# Patient Record
Sex: Male | Born: 1935 | Race: White | Hispanic: No | Marital: Married | Smoking: Never smoker
Health system: Southern US, Community
[De-identification: ages and names within clinical notes are randomized; demographics above are authoritative.]

## PROBLEM LIST (undated history)

## (undated) DIAGNOSIS — C4490 Unspecified malignant neoplasm of skin, unspecified: Secondary | ICD-10-CM

## (undated) DIAGNOSIS — F0280 Dementia in other diseases classified elsewhere without behavioral disturbance: Secondary | ICD-10-CM

## (undated) DIAGNOSIS — G309 Alzheimer's disease, unspecified: Secondary | ICD-10-CM

## (undated) DIAGNOSIS — E785 Hyperlipidemia, unspecified: Secondary | ICD-10-CM

## (undated) DIAGNOSIS — Z8546 Personal history of malignant neoplasm of prostate: Secondary | ICD-10-CM

## (undated) DIAGNOSIS — H269 Unspecified cataract: Secondary | ICD-10-CM

## (undated) HISTORY — DX: Unspecified cataract: H26.9

## (undated) HISTORY — DX: Hyperlipidemia, unspecified: E78.5

## (undated) HISTORY — DX: Personal history of malignant neoplasm of prostate: Z85.46

## (undated) HISTORY — PX: PROSTATE SURGERY: SHX751

## (undated) HISTORY — PX: HIGH DOSE RADIATION IMPLANT INSERTION: SHX6258

---

## 1999-07-20 ENCOUNTER — Other Ambulatory Visit: Admission: RE | Admit: 1999-07-20 | Discharge: 1999-07-20 | Payer: Self-pay | Admitting: Urology

## 1999-08-03 ENCOUNTER — Encounter: Admission: RE | Admit: 1999-08-03 | Discharge: 1999-11-01 | Payer: Self-pay | Admitting: Radiation Oncology

## 1999-10-17 ENCOUNTER — Encounter: Payer: Self-pay | Admitting: Urology

## 1999-10-17 ENCOUNTER — Ambulatory Visit (HOSPITAL_BASED_OUTPATIENT_CLINIC_OR_DEPARTMENT_OTHER): Admission: RE | Admit: 1999-10-17 | Discharge: 1999-10-17 | Payer: Self-pay | Admitting: Urology

## 1999-11-07 ENCOUNTER — Encounter: Payer: Self-pay | Admitting: Radiation Oncology

## 1999-11-07 ENCOUNTER — Encounter: Admission: RE | Admit: 1999-11-07 | Discharge: 2000-02-05 | Payer: Self-pay | Admitting: Radiation Oncology

## 2001-08-06 ENCOUNTER — Ambulatory Visit (HOSPITAL_COMMUNITY): Admission: RE | Admit: 2001-08-06 | Discharge: 2001-08-06 | Payer: Self-pay | Admitting: Gastroenterology

## 2004-11-01 ENCOUNTER — Ambulatory Visit: Payer: Self-pay | Admitting: Internal Medicine

## 2004-11-07 ENCOUNTER — Ambulatory Visit: Payer: Self-pay | Admitting: Gastroenterology

## 2004-11-15 ENCOUNTER — Ambulatory Visit: Payer: Self-pay | Admitting: Gastroenterology

## 2005-07-12 ENCOUNTER — Ambulatory Visit: Payer: Self-pay | Admitting: Internal Medicine

## 2005-07-19 ENCOUNTER — Ambulatory Visit: Payer: Self-pay | Admitting: Internal Medicine

## 2007-10-13 ENCOUNTER — Ambulatory Visit: Payer: Self-pay | Admitting: Internal Medicine

## 2007-10-13 DIAGNOSIS — G459 Transient cerebral ischemic attack, unspecified: Secondary | ICD-10-CM | POA: Insufficient documentation

## 2007-10-13 DIAGNOSIS — J189 Pneumonia, unspecified organism: Secondary | ICD-10-CM

## 2007-10-13 DIAGNOSIS — D696 Thrombocytopenia, unspecified: Secondary | ICD-10-CM

## 2007-10-13 DIAGNOSIS — E782 Mixed hyperlipidemia: Secondary | ICD-10-CM | POA: Insufficient documentation

## 2007-10-13 DIAGNOSIS — E785 Hyperlipidemia, unspecified: Secondary | ICD-10-CM

## 2007-10-13 DIAGNOSIS — F0390 Unspecified dementia without behavioral disturbance: Secondary | ICD-10-CM | POA: Insufficient documentation

## 2007-10-21 ENCOUNTER — Ambulatory Visit: Payer: Self-pay | Admitting: Internal Medicine

## 2007-10-27 ENCOUNTER — Encounter (INDEPENDENT_AMBULATORY_CARE_PROVIDER_SITE_OTHER): Payer: Self-pay | Admitting: *Deleted

## 2007-11-03 ENCOUNTER — Encounter (INDEPENDENT_AMBULATORY_CARE_PROVIDER_SITE_OTHER): Payer: Self-pay | Admitting: *Deleted

## 2007-11-04 ENCOUNTER — Ambulatory Visit: Payer: Self-pay | Admitting: Internal Medicine

## 2007-11-04 DIAGNOSIS — Z8546 Personal history of malignant neoplasm of prostate: Secondary | ICD-10-CM

## 2007-11-04 LAB — CONVERTED CEMR LAB
Cholesterol, target level: 200 mg/dL
HDL goal, serum: 40 mg/dL
LDL Goal: 100 mg/dL

## 2008-01-26 ENCOUNTER — Ambulatory Visit: Payer: Self-pay | Admitting: Internal Medicine

## 2008-01-26 DIAGNOSIS — K625 Hemorrhage of anus and rectum: Secondary | ICD-10-CM

## 2008-01-27 ENCOUNTER — Ambulatory Visit: Payer: Self-pay | Admitting: Internal Medicine

## 2008-02-07 LAB — CONVERTED CEMR LAB
HCT: 38.3 % — ABNORMAL LOW (ref 39.0–52.0)
MCHC: 33.9 g/dL (ref 30.0–36.0)
MCV: 92.8 fL (ref 78.0–100.0)
Platelets: 106 10*3/uL — ABNORMAL LOW (ref 150–400)
Potassium: 3.5 meq/L (ref 3.5–5.1)

## 2008-02-09 ENCOUNTER — Encounter (INDEPENDENT_AMBULATORY_CARE_PROVIDER_SITE_OTHER): Payer: Self-pay | Admitting: *Deleted

## 2008-09-30 ENCOUNTER — Ambulatory Visit: Payer: Self-pay | Admitting: Internal Medicine

## 2008-09-30 DIAGNOSIS — D649 Anemia, unspecified: Secondary | ICD-10-CM | POA: Insufficient documentation

## 2008-09-30 DIAGNOSIS — R0989 Other specified symptoms and signs involving the circulatory and respiratory systems: Secondary | ICD-10-CM

## 2008-10-03 LAB — CONVERTED CEMR LAB
TSH: 0.58 microintl units/mL (ref 0.35–5.50)
Vitamin B-12: 1092 pg/mL — ABNORMAL HIGH (ref 211–911)

## 2008-10-04 ENCOUNTER — Encounter (INDEPENDENT_AMBULATORY_CARE_PROVIDER_SITE_OTHER): Payer: Self-pay | Admitting: *Deleted

## 2008-10-05 ENCOUNTER — Encounter: Payer: Self-pay | Admitting: Internal Medicine

## 2008-10-05 ENCOUNTER — Ambulatory Visit: Payer: Self-pay | Admitting: Cardiovascular Disease

## 2008-10-05 ENCOUNTER — Ambulatory Visit: Payer: Self-pay

## 2008-10-06 ENCOUNTER — Telehealth (INDEPENDENT_AMBULATORY_CARE_PROVIDER_SITE_OTHER): Payer: Self-pay | Admitting: *Deleted

## 2008-10-06 ENCOUNTER — Encounter (INDEPENDENT_AMBULATORY_CARE_PROVIDER_SITE_OTHER): Payer: Self-pay | Admitting: *Deleted

## 2008-10-07 ENCOUNTER — Ambulatory Visit: Payer: Self-pay | Admitting: Internal Medicine

## 2008-10-07 ENCOUNTER — Encounter (INDEPENDENT_AMBULATORY_CARE_PROVIDER_SITE_OTHER): Payer: Self-pay | Admitting: *Deleted

## 2008-10-07 LAB — CONVERTED CEMR LAB
OCCULT 1: NEGATIVE
OCCULT 2: NEGATIVE
OCCULT 3: NEGATIVE

## 2008-10-13 ENCOUNTER — Telehealth (INDEPENDENT_AMBULATORY_CARE_PROVIDER_SITE_OTHER): Payer: Self-pay | Admitting: *Deleted

## 2008-10-19 ENCOUNTER — Encounter: Payer: Self-pay | Admitting: Internal Medicine

## 2009-08-03 ENCOUNTER — Encounter: Payer: Self-pay | Admitting: Internal Medicine

## 2009-10-11 ENCOUNTER — Encounter: Payer: Self-pay | Admitting: Cardiovascular Disease

## 2009-10-12 ENCOUNTER — Ambulatory Visit: Payer: Self-pay

## 2009-10-12 ENCOUNTER — Encounter: Payer: Self-pay | Admitting: Cardiovascular Disease

## 2009-12-20 ENCOUNTER — Telehealth (INDEPENDENT_AMBULATORY_CARE_PROVIDER_SITE_OTHER): Payer: Self-pay | Admitting: *Deleted

## 2010-06-03 ENCOUNTER — Encounter: Payer: Self-pay | Admitting: Internal Medicine

## 2010-07-03 ENCOUNTER — Encounter: Payer: Self-pay | Admitting: Internal Medicine

## 2010-10-22 LAB — CONVERTED CEMR LAB: Glucose, Bld: 92 mg/dL (ref 70–99)

## 2010-10-26 NOTE — Miscellaneous (Signed)
Summary: [facility]  [facility]   Imported By: Lanelle Bal 06/14/2010 09:31:17  _____________________________________________________________________  External Attachment:    Type:   Image     Comment:   External Document

## 2010-10-26 NOTE — Progress Notes (Signed)
Summary: Referral Request  Phone Note Call from Patient Call back at Home Phone [phone]   Caller: Spouse: Alvino Chapel Summary of Call: Message left on VM: Patient would like a referral faxed to [phone] for a hearing test. Patient already called to set up appointment and was instructed to contact his primary.    Chrae Malloy  December 20, 2009 1:38 PM

## 2010-10-26 NOTE — Letter (Signed)
Summary: [facility]  [facility]   Imported By: Lanelle Bal 07/15/2010 10:44:55  _____________________________________________________________________  External Attachment:    Type:   Image     Comment:   External Document

## 2010-10-26 NOTE — Miscellaneous (Signed)
Summary: Orders Update  Clinical Lists Changes  Orders: Added new Test order of Carotid Duplex (Carotid Duplex) - Signed 

## 2011-02-09 NOTE — Op Note (Signed)
[place]  Patient:    KHAMARI SHEEHAN                   MRN: [id number] Proc. Date: 10/17/99 Adm. Date:  [id number] Disc. Date: [id number] Attending:  Lauree Chandler CC:         Titus Dubin. Alwyn Ren, M.D. [facility]             Wynn Banker, M.D.                           Operative Report  PREOPERATIVE DIAGNOSIS:  Prostatic carcinoma.  POSTOPERATIVE DIAGNOSIS:  Prostatic carcinoma.  OPERATION PERFORMED:  Radioactive seed implantation of the prostate and cystoscopy.  SURGEON:  Maretta Bees. Vonita Moss, M.D.  ASSISTANT:  Wynn Banker, M.D.  ANESTHESIA:  INDICATIONS FOR PROCEDURE:  This 77 year old white male who was in general good  health was found to have a PSA of 7.43 and a prostatic biopsy revealed a Gleason grade 5 carcinoma of the prostate.  He opted for radioactive seed implantation n view of his expected good life survival curve.  He was counseled about the procedure.  DESCRIPTION OF PROCEDURE:  The patient was brought to the operating room and placed in the lithotomy position.  The external genitalia were prepped and draped in the usual fashion.  A Foley catheter was inserted as was the rectal tube.  The transrectal ultrasound probe was inserted and correlated with the simulation images and there was noted that the urethra was off center so the seed placement was adjusted to avoid the urethra.  Then under fluoroscopic and ultrasound control, a total of 145 seeds were placed by means of 29 needles with 0.430 mCi per seed.  These seeds were well positioned and nicely distributed.  The patient was then cystoscoped and there were no intraurethral or intravesical seeds noted.  The Foley catheter was reinserted and he was taken to the recovery room in good condition  having tolerated the procedure well. DD:  10/17/99 TD:  10/17/99 Job: [id number]

## 2011-11-06 ENCOUNTER — Other Ambulatory Visit: Payer: Self-pay | Admitting: *Deleted

## 2011-11-06 ENCOUNTER — Encounter (INDEPENDENT_AMBULATORY_CARE_PROVIDER_SITE_OTHER): Payer: Medicare Other | Admitting: *Deleted

## 2011-11-06 DIAGNOSIS — I6529 Occlusion and stenosis of unspecified carotid artery: Secondary | ICD-10-CM

## 2012-09-20 ENCOUNTER — Emergency Department (HOSPITAL_BASED_OUTPATIENT_CLINIC_OR_DEPARTMENT_OTHER)
Admission: EM | Admit: 2012-09-20 | Discharge: 2012-09-21 | Disposition: A | Discharge status: Home / Self Care | Payer: Medicare Other | Attending: Emergency Medicine | Admitting: Emergency Medicine

## 2012-09-20 ENCOUNTER — Encounter (HOSPITAL_BASED_OUTPATIENT_CLINIC_OR_DEPARTMENT_OTHER): Payer: Self-pay

## 2012-09-20 ENCOUNTER — Emergency Department (HOSPITAL_BASED_OUTPATIENT_CLINIC_OR_DEPARTMENT_OTHER): Payer: Medicare Other

## 2012-09-20 DIAGNOSIS — R05 Cough: Secondary | ICD-10-CM | POA: Insufficient documentation

## 2012-09-20 DIAGNOSIS — R059 Cough, unspecified: Secondary | ICD-10-CM | POA: Insufficient documentation

## 2012-09-20 DIAGNOSIS — G309 Alzheimer's disease, unspecified: Secondary | ICD-10-CM | POA: Insufficient documentation

## 2012-09-20 DIAGNOSIS — F0280 Dementia in other diseases classified elsewhere without behavioral disturbance: Secondary | ICD-10-CM | POA: Insufficient documentation

## 2012-09-20 DIAGNOSIS — R269 Unspecified abnormalities of gait and mobility: Secondary | ICD-10-CM | POA: Insufficient documentation

## 2012-09-20 DIAGNOSIS — J189 Pneumonia, unspecified organism: Secondary | ICD-10-CM

## 2012-09-20 HISTORY — DX: Alzheimer's disease, unspecified: G30.9

## 2012-09-20 HISTORY — DX: Dementia in other diseases classified elsewhere, unspecified severity, without behavioral disturbance, psychotic disturbance, mood disturbance, and anxiety: F02.80

## 2012-09-20 LAB — CBC
Hemoglobin: 12.9 g/dL — ABNORMAL LOW (ref 13.0–17.0)
RBC: 4.19 MIL/uL — ABNORMAL LOW (ref 4.22–5.81)

## 2012-09-20 LAB — BASIC METABOLIC PANEL
CO2: 22 mEq/L (ref 19–32)
Glucose, Bld: 127 mg/dL — ABNORMAL HIGH (ref 70–99)
Potassium: 4 mEq/L (ref 3.5–5.1)
Sodium: 139 mEq/L (ref 135–145)

## 2012-09-20 MED ORDER — LEVOFLOXACIN 500 MG PO TABS: 500.0000 mg | ORAL_TABLET | Freq: Every day | ORAL | Status: DC

## 2012-09-20 MED ORDER — AZITHROMYCIN 250 MG PO TABS
500.0000 mg | ORAL_TABLET | Freq: Once | ORAL | Status: AC
  Administered 2012-09-20: 500 mg via ORAL
  Filled 2012-09-20: qty 2

## 2012-09-20 MED ORDER — DEXTROSE 5 % IV SOLN
1.0000 g | Freq: Once | INTRAVENOUS | Status: AC
  Administered 2012-09-20: 1 g via INTRAVENOUS
  Filled 2012-09-20: qty 10

## 2012-09-20 MED ORDER — MOXIFLOXACIN HCL 400 MG PO TABS: 400.0000 mg | ORAL_TABLET | Freq: Every day | ORAL | Status: DC

## 2012-09-20 MED ORDER — ACETAMINOPHEN 325 MG PO TABS: 650.0000 mg | ORAL_TABLET | Freq: Once | ORAL | Status: DC

## 2012-09-20 MED ORDER — SODIUM CHLORIDE 0.9 % IV BOLUS (SEPSIS)
500.0000 mL | Freq: Once | INTRAVENOUS | Status: AC
  Administered 2012-09-20: 500 mL via INTRAVENOUS

## 2012-09-20 NOTE — ED Provider Notes (Signed)
History   This chart was scribed for Ethelda Chick, MD scribed by Magnus Sinning. The patient was seen in room MH02/MH02 at 22:02   CSN: [id number]  Arrival date & time 09/20/12  2057  Chief Complaint  Patient presents with  . Fever    (Consider location/radiation/quality/duration/timing/severity/associated sxs/prior treatment) HPI Comments: Vincent Lee is a 78 y.o. male BIB EMS who presents to the Emergency Department complaining of a constant fever that began approximately 3 hours ago.   Pt has hx of alzheimer's. Wife states she noticed the pt was unsteady standing and very warm after dinner this evening. She notes pt has had a mild productive cough, which she notes appeared better today. The patient has hx of PNA 6 times, but notes this is not current. She says he has been eating and drinking well.   Patient is a 78 y.o. male presenting with fever. The history is provided by the spouse. No language interpreter was used.  Fever Primary symptoms of the febrile illness include fever and cough. Primary symptoms do not include abdominal pain. The current episode started today. This is a new problem. The problem has not changed since onset. The fever began today. The fever has been unchanged since its onset. The maximum temperature recorded prior to his arrival was 100 to 100.9 F.  The cough began 3 to 5 days ago. The cough is new. The cough is hacking and productive. There is nondescript sputum produced.    Past Medical History  Diagnosis Date  . Alzheimer disease     History reviewed. No pertinent past surgical history.  No family history on file.  History  Substance Use Topics  . Smoking status: Never Smoker   . Smokeless tobacco: Not on file  . Alcohol Use: No      Review of Systems  Constitutional: Positive for fever.  Respiratory: Positive for cough.   Gastrointestinal: Negative for abdominal pain.  Musculoskeletal: Positive for gait problem.  All other  systems reviewed and are negative.    Allergies  Review of patient's allergies indicates no known allergies.  Home Medications   Current Outpatient Rx  Name  Route  Sig  Dispense  Refill  . DONEPEZIL HCL 10 MG PO TABS   Oral   Take 10 mg by mouth at bedtime as needed.         Marland Kitchen MEMANTINE HCL 10 MG PO TABS   Oral   Take 10 mg by mouth 2 (two) times daily.         Marland Kitchen LEVOFLOXACIN 500 MG PO TABS   Oral   Take 1 tablet (500 mg total) by mouth daily.   10 tablet   0     Pulse 88  Temp 100.8 F (38.2 C) (Oral)  Resp 23  SpO2 96%  Physical Exam  Nursing note and vitals reviewed. Constitutional: He is oriented to person, place, and time. He appears well-developed and well-nourished. No distress.  HENT:  Head: Normocephalic and atraumatic.  Mouth/Throat: Oropharynx is clear and moist.  Eyes: Conjunctivae normal and EOM are normal.  Neck: Neck supple. No tracheal deviation present.  Cardiovascular: Normal rate, regular rhythm and normal heart sounds.   Pulmonary/Chest: Effort normal and breath sounds normal. No respiratory distress. He has no wheezes. He has no rales.  Abdominal: Soft. Bowel sounds are normal. He exhibits no distension. There is no tenderness.  Musculoskeletal: Normal range of motion. He exhibits no edema and no tenderness.  Neurological: He is  alert and oriented to person, place, and time. No sensory deficit.  Skin: Skin is warm and dry.  Psychiatric: He has a normal mood and affect. His behavior is normal.    ED Course  Procedures (including critical care time) DIAGNOSTIC STUDIES: Oxygen Saturation is 96% on room air, normal by my interpretation.    COORDINATION OF CARE: 22:04: Physical exam performed.  Labs Reviewed  CBC - Abnormal; Notable for the following:    RBC 4.19 (*)     Hemoglobin 12.9 (*)     HCT 37.7 (*)     Platelets 53 (*)     All other components within normal limits  BASIC METABOLIC PANEL - Abnormal; Notable for the  following:    Glucose, Bld 127 (*)     GFR calc non Af Amer 77 (*)     GFR calc Af Amer 89 (*)     All other components within normal limits  URINALYSIS, ROUTINE W REFLEX MICROSCOPIC - Abnormal; Notable for the following:    Color, Urine RED (*)  BIOCHEMICALS MAY BE AFFECTED BY COLOR   APPearance CLOUDY (*)     Hgb urine dipstick LARGE (*)     Bilirubin Urine SMALL (*)     Ketones, ur 15 (*)     Protein, ur 30 (*)     Leukocytes, UA SMALL (*)     All other components within normal limits  URINE MICROSCOPIC-ADD ON - Abnormal; Notable for the following:    Squamous Epithelial / LPF FEW (*)     All other components within normal limits  URINE CULTURE   Dg Chest 2 View  09/20/2012  *RADIOLOGY REPORT*  Clinical Data: Fever.  CHEST - 2 VIEW  Comparison: None.  Findings: Underlying COPD present.  Subtle areas of probable pneumonia are suspected in both lower lung zones and also potentially the lingula.  No pulmonary edema or pleural fluid identified.  Heart size and mediastinal contours are within normal limits.  Visualized bony structures are unremarkable.  IMPRESSION: Probable developing bilateral pneumonia superimposed on underlying COPD.   Original Report Authenticated By: Irish Lack, M.D.      1. Community acquired pneumonia       MDM  Pt presenting with c/o fever. Fever began shortly after eating dinner.  Has had mild cough for past few days.  No other symptoms.  Wife states he is currently at his baseline.  CXR shows probable developoing pneumonia (xray images reviewed by me as well) pt has normal vital signs, no oxygen requirement.  No recent abx or hospitalizations.  Pt started on rocephin and zithromax for CAP.  UA shows RBCs- had unsuccessful attempt at urinary cath.  Urine sent for culture, but I think the mild pneumonia is more likely to be source of low grade temp.  Pt discharged on levaquin.  Discharged with strict return precautions.  Pt and family agreeable with  plan.   I personally performed the services described in this documentation, which was scribed in my presence. The recorded information has been reviewed and is accurate.         Ethelda Chick, MD 09/21/12 [phone]

## 2012-09-20 NOTE — ED Notes (Signed)
Patient arrived by EMS from home after spouse noticed that patient developed fever at 6pm this evening. Spouse concerned with the sudden onset, no other complaints. Has remote history of pneumonia. Patient received 1000mg  of tylenol pta. Patient with alzheimer's.

## 2012-09-20 NOTE — ED Notes (Signed)
I assisted patient with urinal, got sample, took to lab.

## 2012-09-20 NOTE — ED Notes (Signed)
I took vitals and placed patient in gown, wife at bedside stated they did not get his hearing aids before arrival. EMS stated patient is alzheimer patient.

## 2012-09-21 LAB — URINALYSIS, ROUTINE W REFLEX MICROSCOPIC
Glucose, UA: NEGATIVE mg/dL
Protein, ur: 30 mg/dL — AB
Specific Gravity, Urine: 1.028 (ref 1.005–1.030)

## 2012-09-21 LAB — URINE MICROSCOPIC-ADD ON

## 2012-09-22 LAB — URINE CULTURE
Colony Count: NO GROWTH
Culture: NO GROWTH

## 2012-09-23 ENCOUNTER — Ambulatory Visit (INDEPENDENT_AMBULATORY_CARE_PROVIDER_SITE_OTHER): Payer: Medicare Other | Admitting: Internal Medicine

## 2012-09-23 ENCOUNTER — Encounter: Payer: Self-pay | Admitting: Internal Medicine

## 2012-09-23 VITALS — BP 126/60 | HR 67 | Temp 97.9°F | Wt 123.2 lb

## 2012-09-23 DIAGNOSIS — J189 Pneumonia, unspecified organism: Secondary | ICD-10-CM

## 2012-09-23 NOTE — Patient Instructions (Addendum)
Order for x-rays entered into  the computer; these will be performed at [facility] 09/29/2012. No appointment is necessary.  Please  blowup at least 5-10  balloons a day to enhance inflation of the lungs and prevent atelectasis & delay resolution of pneumonia as we discussed.

## 2012-09-23 NOTE — Progress Notes (Signed)
  Subjective:    Patient ID: Vincent Lee, male    DOB: 09/12/1921, 78 y.o.   MRN: [id number]  HPI The respiratory tract symptoms began  09/20/12 as weakness with "wobbly " gait, hot & confusion.  No chills and sweats initially present ,but sweating after IV antibiotics in ER  Cough was not associated with shortness of breath or wheezing  Sputum was not visualized    Fever resolved by 12/29 w/o  NSAIDS or Tylenol  There is no history of asthma. The patient had never smoked                  Review of Systems Symptoms not present included frontal headache, facial pain, dental pain, sore throat, nasal purulence, earache , and otic discharge  Itchy , watery eyes & sneezing were not noted.      Objective:   Physical Exam  General appearance:thin but good health ;well nourished; no acute distress or increased work of breathing is present.  No  lymphadenopathy about the head, neck, or axilla noted.  Eyes: No conjunctival inflammation or lid edema is present.  Ears:  Hearing aids bilaterally Nose:  External nasal examination shows no deformity or inflammation. Nasal mucosa are pink and moist without lesions or exudates. No septal dislocation or deviation.No obstruction to airflow.  Oral exam: Dental hygiene is good; lips and gums are healthy appearing.There is no oropharyngeal erythema or exudate noted.  Neck:  No deformities,  masses, or tenderness noted.    Heart:  Normal rate ; some respiratory variation to  rhythm. S1 and S2 normal without gallop, murmur, click, rub or other extra sounds.  Lungs:Chest clear to auscultation; no wheezes, rhonchi,rales ,or rubs present.No increased work of breathing.  BS slightly decreased @ bases Extremities:  No cyanosis, edema, or clubbing  noted  Skin: Warm & dry        Assessment & Plan:  #1 PNA; clinically stable

## 2012-09-29 ENCOUNTER — Ambulatory Visit (HOSPITAL_BASED_OUTPATIENT_CLINIC_OR_DEPARTMENT_OTHER)
Admission: RE | Admit: 2012-09-29 | Discharge: 2012-09-29 | Disposition: A | Discharge status: Home / Self Care | Payer: Medicare Other | Source: Ambulatory Visit | Attending: Internal Medicine | Admitting: Internal Medicine

## 2012-09-29 DIAGNOSIS — J189 Pneumonia, unspecified organism: Secondary | ICD-10-CM

## 2012-09-29 DIAGNOSIS — R05 Cough: Secondary | ICD-10-CM | POA: Insufficient documentation

## 2012-09-29 DIAGNOSIS — R059 Cough, unspecified: Secondary | ICD-10-CM | POA: Insufficient documentation

## 2012-12-19 ENCOUNTER — Encounter: Payer: Self-pay | Admitting: Internal Medicine

## 2012-12-19 ENCOUNTER — Ambulatory Visit (INDEPENDENT_AMBULATORY_CARE_PROVIDER_SITE_OTHER): Payer: Medicare Other | Admitting: Internal Medicine

## 2012-12-19 VITALS — BP 120/72 | HR 60 | Temp 97.8°F | Resp 12 | Ht 68.08 in | Wt 119.0 lb

## 2012-12-19 DIAGNOSIS — Z8546 Personal history of malignant neoplasm of prostate: Secondary | ICD-10-CM

## 2012-12-19 DIAGNOSIS — Z111 Encounter for screening for respiratory tuberculosis: Secondary | ICD-10-CM

## 2012-12-19 DIAGNOSIS — R413 Other amnesia: Secondary | ICD-10-CM

## 2012-12-19 DIAGNOSIS — Z23 Encounter for immunization: Secondary | ICD-10-CM

## 2012-12-19 NOTE — Progress Notes (Signed)
  Subjective:    Patient ID: Vincent Lee, male    DOB: 11-Mar-1921, 79 y.o.   MRN: [id number]  HPI  He is wife are relocating to retirement facility,[facility].Medical assessment in short update required.   He has no active or acute health issues.  Past medical history was reviewed in detail and updated    Review of Systems  He is followed by Dr. Terrace Arabia, neurologist and is on generic Aricept and Namenda     Objective:   Physical Exam Gen.: Thin but healthy and adequately nourished in appearance. Alert  and cooperative throughout exam. Appears younger than stated age  Head: Normocephalic without obvious abnormalities; pattern alopecia  Eyes: No corneal or conjunctival inflammation noted.  Ears: External  ear exam reveals no significant lesions or deformities.  Hearing aids  bilaterally. Nose: External nasal exam reveals no deformity or inflammation. Nasal mucosa are pink and moist. No lesions or exudates noted.  Mouth: Oral mucosa and oropharynx reveal no lesions or exudates. Teeth in good repair. Lower partial Neck: No deformities, masses, or tenderness noted.  Thyroid normal. Lungs: Normal respiratory effort; chest expands symmetrically. Lungs are clear to auscultation without rales, wheezes, or increased work of breathing. Heart: Normal rate and rhythm. Normal S1 and S2. No gallop, click, or rub. Grade 1/6 systolic murmur. Abdomen: Bowel sounds normal; abdomen soft and nontender. No masses, organomegaly or hernias noted. Genitalia: deferred                                Musculoskeletal/extremities: Accentuated curvature of mid thoracic spine. No clubbing, cyanosis, edema, or significant extremity  deformity noted. Tone & strength  Normal. Joints normal . Nail health good. Able to lie down & sit up w/o help. Negative SLR bilaterally Vascular: Carotid, radial artery, dorsalis pedis and  posterior tibial pulses are full and equal. R > L carotid bruits present. Neurologic:  Alert but not oriented to date or President. Deep tendon reflexes symmetrical and normal.  Gait normal  .        Skin: Intact without suspicious lesions or rashes. Lymph: No cervical, axillary lymphadenopathy present. Psych:Reserved demeanor. Normally interactive                                                                                        Assessment & Plan:  #1 comprehensive exam; no acute findings  Plan: form completed

## 2012-12-19 NOTE — Patient Instructions (Addendum)
Review and correct the record as indicated. Please share record with [facility]  medical staff .  If you activate the  My Chart system; lab & Xray results will be released directly  to you as soon as I review & address these through the computer. If you choose not to sign up for My Chart within 36 hours of labs being drawn; results will be reviewed & interpretation added before being copied & mailed, causing a delay in getting the results to you.If you do not receive that report within 7-10 days ,please call. Additionally you can use this system to gain direct  access to your records  if  out of town or @ an office of a  physician who is not in  the My Chart network.  This improves continuity of care & places you in control of your medical record.

## 2012-12-22 ENCOUNTER — Telehealth: Payer: Self-pay | Admitting: *Deleted

## 2012-12-22 ENCOUNTER — Ambulatory Visit: Payer: Medicare Other | Admitting: *Deleted

## 2012-12-22 ENCOUNTER — Encounter: Payer: Self-pay | Admitting: *Deleted

## 2012-12-22 DIAGNOSIS — Z0000 Encounter for general adult medical examination without abnormal findings: Secondary | ICD-10-CM

## 2012-12-22 LAB — TB SKIN TEST
Induration: 0 mm
TB Skin Test: NEGATIVE

## 2013-02-15 ENCOUNTER — Emergency Department (HOSPITAL_BASED_OUTPATIENT_CLINIC_OR_DEPARTMENT_OTHER): Payer: Medicare Other

## 2013-02-15 ENCOUNTER — Encounter (HOSPITAL_BASED_OUTPATIENT_CLINIC_OR_DEPARTMENT_OTHER): Payer: Self-pay

## 2013-02-15 ENCOUNTER — Emergency Department (HOSPITAL_BASED_OUTPATIENT_CLINIC_OR_DEPARTMENT_OTHER)
Admission: EM | Admit: 2013-02-15 | Discharge: 2013-02-15 | Disposition: A | Discharge status: Home / Self Care | Payer: Medicare Other | Attending: Emergency Medicine | Admitting: Emergency Medicine

## 2013-02-15 DIAGNOSIS — Z862 Personal history of diseases of the blood and blood-forming organs and certain disorders involving the immune mechanism: Secondary | ICD-10-CM | POA: Insufficient documentation

## 2013-02-15 DIAGNOSIS — R197 Diarrhea, unspecified: Secondary | ICD-10-CM

## 2013-02-15 DIAGNOSIS — G309 Alzheimer's disease, unspecified: Secondary | ICD-10-CM | POA: Insufficient documentation

## 2013-02-15 DIAGNOSIS — D696 Thrombocytopenia, unspecified: Secondary | ICD-10-CM | POA: Insufficient documentation

## 2013-02-15 DIAGNOSIS — R112 Nausea with vomiting, unspecified: Secondary | ICD-10-CM | POA: Insufficient documentation

## 2013-02-15 DIAGNOSIS — Z79899 Other long term (current) drug therapy: Secondary | ICD-10-CM | POA: Insufficient documentation

## 2013-02-15 DIAGNOSIS — Z8639 Personal history of other endocrine, nutritional and metabolic disease: Secondary | ICD-10-CM | POA: Insufficient documentation

## 2013-02-15 DIAGNOSIS — F0280 Dementia in other diseases classified elsewhere without behavioral disturbance: Secondary | ICD-10-CM | POA: Insufficient documentation

## 2013-02-15 DIAGNOSIS — Z8546 Personal history of malignant neoplasm of prostate: Secondary | ICD-10-CM | POA: Insufficient documentation

## 2013-02-15 LAB — CBC WITH DIFFERENTIAL/PLATELET
Lymphocytes Relative: 12 % (ref 12–46)
Lymphs Abs: 0.7 10*3/uL (ref 0.7–4.0)
MCH: 31.5 pg (ref 26.0–34.0)
MCHC: 34.4 g/dL (ref 30.0–36.0)
Myelocytes: 0 %
Neutro Abs: 3.6 10*3/uL (ref 1.7–7.7)
Neutrophils Relative %: 62 % (ref 43–77)
Platelets: 35 10*3/uL — ABNORMAL LOW (ref 150–400)
Promyelocytes Absolute: 0 %
RBC: 4.32 MIL/uL (ref 4.22–5.81)
nRBC: 0 /100 WBC

## 2013-02-15 LAB — COMPREHENSIVE METABOLIC PANEL
ALT: 11 U/L (ref 0–53)
AST: 25 U/L (ref 0–37)
Alkaline Phosphatase: 60 U/L (ref 39–117)
CO2: 24 mEq/L (ref 19–32)
Calcium: 9.1 mg/dL (ref 8.4–10.5)
Chloride: 106 mEq/L (ref 96–112)
GFR calc non Af Amer: 73 mL/min — ABNORMAL LOW (ref >90)
Potassium: 3.4 mEq/L — ABNORMAL LOW (ref 3.5–5.1)
Sodium: 141 mEq/L (ref 135–145)
Total Bilirubin: 0.7 mg/dL (ref 0.3–1.2)

## 2013-02-15 LAB — URINALYSIS, ROUTINE W REFLEX MICROSCOPIC
Bilirubin Urine: NEGATIVE
Glucose, UA: NEGATIVE mg/dL
Protein, ur: NEGATIVE mg/dL

## 2013-02-15 LAB — URINE MICROSCOPIC-ADD ON

## 2013-02-15 MED ORDER — IOHEXOL 300 MG/ML  SOLN
25.0000 mL | Freq: Once | INTRAMUSCULAR | Status: AC | PRN
  Administered 2013-02-15: 25 mL via ORAL

## 2013-02-15 MED ORDER — DIPHENOXYLATE-ATROPINE 2.5-0.025 MG PO TABS
1.0000 | ORAL_TABLET | Freq: Once | ORAL | Status: AC
  Administered 2013-02-15: 1 via ORAL
  Filled 2013-02-15: qty 1

## 2013-02-15 MED ORDER — SODIUM CHLORIDE 0.9 % IV BOLUS (SEPSIS)
1000.0000 mL | Freq: Once | INTRAVENOUS | Status: AC
  Administered 2013-02-15: 1000 mL via INTRAVENOUS

## 2013-02-15 MED ORDER — IOHEXOL 300 MG/ML  SOLN
100.0000 mL | Freq: Once | INTRAMUSCULAR | Status: AC | PRN
  Administered 2013-02-15: 100 mL via INTRAVENOUS

## 2013-02-15 MED ORDER — ONDANSETRON HCL 4 MG/2ML IJ SOLN
4.0000 mg | Freq: Once | INTRAMUSCULAR | Status: AC
  Administered 2013-02-15: 4 mg via INTRAVENOUS
  Filled 2013-02-15: qty 2

## 2013-02-15 MED ORDER — LOPERAMIDE HCL 2 MG PO CAPS: 2.0000 mg | ORAL_CAPSULE | Freq: Four times a day (QID) | ORAL | Status: DC | PRN

## 2013-02-15 NOTE — ED Notes (Signed)
Pt unable to void at this time. Pt given urinal and is aware we need urine sample.

## 2013-02-15 NOTE — ED Notes (Signed)
Wife states that the patient has had multiple episodes of diarrhea and intermittently vomiting since Friday.  Symptoms resolved Saturday, but this morning came back, no vomiting today, but many episodes of diarrhea.

## 2013-02-15 NOTE — ED Provider Notes (Addendum)
History     CSN: [id number]  Arrival date & time 02/15/13  0726   First MD Initiated Contact with Patient 02/15/13 [phone]      Chief Complaint  Patient presents with  . Diarrhea    (Consider location/radiation/quality/duration/timing/severity/associated sxs/prior treatment) HPI Comments: Patient complains of diarrhea. Most of the history is obtained from the wife as the patient has some mild dementia and has trouble remembering details. The wife states that today's he oh the patient had some vomiting and diarrhea. Yesterday he felt better with no diarrhea no vomiting. He ate some cereal, banana, and RBCs chicken sandwich. Last night at about 7 PM he started having diarrhea again. He's had about 12 episodes of watery nonbloody diarrhea. He's had no further episodes of vomiting. He denies any abdominal pain. He's had no fevers or chills. He had no cough or chest congestion. He's had no other recent illnesses. He's had no recent antibiotic use or recent hospitalizations.  Patient is a 79 y.o. male presenting with diarrhea.  Diarrhea Associated symptoms: vomiting   Associated symptoms: no abdominal pain, no arthralgias, no chills, no diaphoresis, no fever and no headaches     Past Medical History  Diagnosis Date  . Alzheimer disease   . Hyperlipidemia   . H/O prostate cancer     Past Surgical History  Procedure Laterality Date  . High dose radiation implant insertion      for prostate ca    Family History  Problem Relation Age of Onset  . Cancer Brother      ? lung  . Diabetes Brother     not definite  . Heart disease Neg Hx   . Stroke Neg Hx   . Dementia Mother     History  Substance Use Topics  . Smoking status: Never Smoker   . Smokeless tobacco: Never Used  . Alcohol Use: No      Review of Systems  Constitutional: Negative for fever, chills, diaphoresis and fatigue.  HENT: Negative for congestion, rhinorrhea and sneezing.   Eyes: Negative.   Respiratory:  Negative for cough, chest tightness and shortness of breath.   Cardiovascular: Negative for chest pain and leg swelling.  Gastrointestinal: Positive for nausea, vomiting and diarrhea. Negative for abdominal pain and blood in stool.  Genitourinary: Negative for frequency, hematuria, flank pain and difficulty urinating.  Musculoskeletal: Negative for back pain and arthralgias.  Skin: Negative for rash.  Neurological: Negative for dizziness, speech difficulty, weakness, numbness and headaches.    Allergies  Review of patient's allergies indicates no known allergies.  Home Medications   Current Outpatient Rx  Name  Route  Sig  Dispense  Refill  . donepezil (ARICEPT) 10 MG tablet   Oral   Take 10 mg by mouth at bedtime.          . memantine (NAMENDA) 10 MG tablet   Oral   Take 10 mg by mouth daily.          . Multiple Vitamins-Minerals (MULTIVITAMIN PO)   Oral   Take by mouth daily.         Marland Kitchen loperamide (IMODIUM) 2 MG capsule   Oral   Take 1 capsule (2 mg total) by mouth 4 (four) times daily as needed for diarrhea or loose stools.   12 capsule   0     BP 115/81  Pulse 69  Temp(Src) 98.3 F (36.8 C) (Oral)  Resp 20  Ht 5\' 9"  (1.753 m)  Wt 120 lb (  54.432 kg)  BMI 17.71 kg/m2  SpO2 95%  Physical Exam  Constitutional: He appears well-developed and well-nourished.  HENT:  Head: Normocephalic and atraumatic.  Mouth/Throat: Oropharynx is clear and moist.  Eyes: Pupils are equal, round, and reactive to light.  Neck: Normal range of motion. Neck supple.  Cardiovascular: Normal rate and normal heart sounds.  An irregularly irregular rhythm present.  Pulmonary/Chest: Effort normal and breath sounds normal. No respiratory distress. He has no wheezes. He has no rales. He exhibits no tenderness.  Abdominal: Soft. There is no tenderness. There is no rebound and no guarding.  Rumbling bowel sounds  Musculoskeletal: Normal range of motion. He exhibits no edema.   Lymphadenopathy:    He has no cervical adenopathy.  Neurological: He is alert.  Patient is sitting up in the bed smiling. He answers questions appropriately but is confused to details.  Skin: Skin is warm and dry. No rash noted.  Psychiatric: He has a normal mood and affect.    ED Course  Procedures (including critical care time)  Results for orders placed during the hospital encounter of 02/15/13  CBC WITH DIFFERENTIAL      Result Value Range   WBC 5.8  4.0 - 10.5 K/uL   RBC 4.32  4.22 - 5.81 MIL/uL   Hemoglobin 13.6  13.0 - 17.0 g/dL   HCT 16.1  09.6 - 04.5 %   MCV 91.4  78.0 - 100.0 fL   MCH 31.5  26.0 - 34.0 pg   MCHC 34.4  30.0 - 36.0 g/dL   RDW 40.9 (*) 81.1 - 91.4 %   Platelets 35 (*) 150 - 400 K/uL   Neutrophils Relative % 62  43 - 77 %   Lymphocytes Relative 12  12 - 46 %   Monocytes Relative 24 (*) 3 - 12 %   Eosinophils Relative 2  0 - 5 %   Basophils Relative 0  0 - 1 %   Band Neutrophils 0  0 - 10 %   Metamyelocytes Relative 0     Myelocytes 0     Promyelocytes Absolute 0     Blasts 0     nRBC 0  0 /100 WBC   Neutro Abs 3.6  1.7 - 7.7 K/uL   Lymphs Abs 0.7  0.7 - 4.0 K/uL   Monocytes Absolute 1.4 (*) 0.1 - 1.0 K/uL   Eosinophils Absolute 0.1  0.0 - 0.7 K/uL   Basophils Absolute 0.0  0.0 - 0.1 K/uL   RBC Morphology BURR CELLS     WBC Morphology VACUOLATED NEUTROPHILS     Smear Review LARGE PLATELETS PRESENT    COMPREHENSIVE METABOLIC PANEL      Result Value Range   Sodium 141  135 - 145 mEq/L   Potassium 3.4 (*) 3.5 - 5.1 mEq/L   Chloride 106  96 - 112 mEq/L   CO2 24  19 - 32 mEq/L   Glucose, Bld 103 (*) 70 - 99 mg/dL   BUN 18  6 - 23 mg/dL   Creatinine, Ser 7.82  0.50 - 1.35 mg/dL   Calcium 9.1  8.4 - 95.6 mg/dL   Total Protein 6.3  6.0 - 8.3 g/dL   Albumin 3.9  3.5 - 5.2 g/dL   AST 25  0 - 37 U/L   ALT 11  0 - 53 U/L   Alkaline Phosphatase 60  39 - 117 U/L   Total Bilirubin 0.7  0.3 - 1.2 mg/dL   GFR  calc non Af Amer 73 (*) >90 mL/min   GFR  calc Af Amer 85 (*) >90 mL/min  LIPASE, BLOOD      Result Value Range   Lipase 40  11 - 59 U/L  URINALYSIS, ROUTINE W REFLEX MICROSCOPIC      Result Value Range   Color, Urine AMBER (*) YELLOW   APPearance CLEAR  CLEAR   Specific Gravity, Urine 1.026  1.005 - 1.030   pH 5.5  5.0 - 8.0   Glucose, UA NEGATIVE  NEGATIVE mg/dL   Hgb urine dipstick TRACE (*) NEGATIVE   Bilirubin Urine NEGATIVE  NEGATIVE   Ketones, ur NEGATIVE  NEGATIVE mg/dL   Protein, ur NEGATIVE  NEGATIVE mg/dL   Urobilinogen, UA 1.0  0.0 - 1.0 mg/dL   Nitrite NEGATIVE  NEGATIVE   Leukocytes, UA NEGATIVE  NEGATIVE  URINE MICROSCOPIC-ADD ON      Result Value Range   Squamous Epithelial / LPF RARE  RARE   WBC, UA 0-2  <3 WBC/hpf   RBC / HPF 3-6  <3 RBC/hpf   Casts HYALINE CASTS (*) NEGATIVE   Urine-Other MUCOUS PRESENT     No results found.    Date: 02/15/2013  Rate: 67  Rhythm: normal sinus rhythm and premature atrial contractions (PAC)  QRS Axis: normal  Intervals: normal  ST/T Wave abnormalities: nonspecific ST/T changes  Conduction Disutrbances:none  Narrative Interpretation:   Old EKG Reviewed: unchanged   1. Diarrhea   2. Thrombocytopenia       MDM  Patient ongoing watery diarrhea. I had a long discussion with the patient's wife regarding admission versus discharge at home. Patient currently has no vomiting with no signs of bowel obstruction. He is continuing to have episodes of diarrhea. He did not appear to be dehydrated. She has not to this point to take him home and given outpatient trial for 24 hours. I advised her that if he has ongoing multiple episodes of diarrhea or he appears to worsen in anyway debris and bacterial plan on admitting him. He has dementia and she is concerned about them being in the hospital without her. I feel that this is reasonable and she is comfortable bringing the back of his symptoms worsen. I also discussed that she will need a followup with his primary care physician  regarding the thrombocytopenia. He has no signs of active bleeding. His stool was sent off for C. difficile testing        Rolan Bucco, MD 02/15/13 1320  Rolan Bucco, MD 02/15/13 1321  Rolan Bucco, MD 02/15/13 1321

## 2013-02-23 ENCOUNTER — Ambulatory Visit (INDEPENDENT_AMBULATORY_CARE_PROVIDER_SITE_OTHER): Payer: Medicare Other | Admitting: Internal Medicine

## 2013-02-23 ENCOUNTER — Encounter: Payer: Self-pay | Admitting: Internal Medicine

## 2013-02-23 VITALS — BP 122/74 | HR 48 | Temp 97.8°F | Wt 121.0 lb

## 2013-02-23 DIAGNOSIS — E876 Hypokalemia: Secondary | ICD-10-CM

## 2013-02-23 DIAGNOSIS — R197 Diarrhea, unspecified: Secondary | ICD-10-CM

## 2013-02-23 DIAGNOSIS — D696 Thrombocytopenia, unspecified: Secondary | ICD-10-CM

## 2013-02-23 DIAGNOSIS — R112 Nausea with vomiting, unspecified: Secondary | ICD-10-CM

## 2013-02-23 NOTE — Patient Instructions (Addendum)
Stay on clear liquids for 48-72 hours or until bowels are normal.This would include  jello, sherbert (NOT ice cream), Lipton's chicken noodle soup(NOT cream based soups),Gatorade Lite, flat Ginger ale (without High Fructose Corn Syrup),dry toast or crackers, baked potato.No milk , dairy or grease until bowels are formed. Align , a [employer] , daily if stools are loose. Immodium AD for frankly watery stool. Report increasing pain, fever or rectal bleeding. Hold all meds until asymptomatic for 72 hrs

## 2013-02-23 NOTE — Progress Notes (Signed)
  Subjective:    Patient ID: Vincent Lee, male    DOB: 1921-08-14, 79 y.o.   MRN: [id number]  HPI His symptoms began 02/13/13 as diarrhea 4-5 times followed by vomiting on 2 occasions. This responded to Imodium but the diarrhea recurred 02/15/13 prompting a visit to the emergency room. He had had watery bowel movements on 12 occasions prior to the ER visit.  The emergency room visit record was reviewed. White blood count was 5800; there was no anemia. His platelet count was markedly decreased at 35,000. Potassium was mildly decreased at 3.4.  The diarrhea resolved until this morning when it was again watery. This again responded to Imodium.  Subsequently his wife has developed similar type symptoms (02/23/13 record). The been no other sick individual, animal/pet, travel exposures, or suspect food or liquid ingestion.   Review of Systems He has had no epistaxis, hemoptysis, hematuria, melena, or rectal bleeding.He has no unexplained weight loss, dysphagia, or abdominal pain. He has no abnormal bruising or bleeding. He has no difficulty stopping bleeding with injury.     Objective:   Physical Exam Gen.: Thin but adequately nourished in appearance. Alert and cooperative throughout exam despite dementia Ears: aids bilaterally which hum constantly  Eyes: No conjunctival inflammation or scleral icterus is present.  Oral exam: Dental hygiene is good; lips and gums are healthy appearing.There is no oropharyngeal erythema or exudate noted.   Heart:  Normal rate ; occasional premature. S1 and S2 normal without gallop,  click, rub or other extra sounds . Grade 1/6 systolic murmur   Lungs:minimal bibasilar  present.No increased work of breathing.   Abdomen: bowel sounds normal, firm and non-tender without masses, organomegaly or hernias noted.  No guarding or rebound   Skin:Warm & dry.  Intact without suspicious lesions or rashes ; no jaundice. Some tenting present  Lymphatic: No  lymphadenopathy is noted about the head, neck, axilla.             Assessment & Plan:  #1 diarrhea, responsive to Imodium  #2 nausea vomiting, resolved  #3 Hypokalemia  #4 thrombocytopenia without bleeding dyscrasias  Plan: See orders recommendations

## 2013-02-24 LAB — CBC WITH DIFFERENTIAL/PLATELET
Basophils Absolute: 0.1 10*3/uL (ref 0.0–0.1)
Basophils Relative: 1.8 % (ref 0.0–3.0)
Eosinophils Absolute: 0.1 10*3/uL (ref 0.0–0.7)
HCT: 39.6 % (ref 39.0–52.0)
Hemoglobin: 13.1 g/dL (ref 13.0–17.0)
Lymphs Abs: 1 10*3/uL (ref 0.7–4.0)
MCHC: 33.1 g/dL (ref 30.0–36.0)
MCV: 93 fl (ref 78.0–100.0)
Neutro Abs: 2.3 10*3/uL (ref 1.4–7.7)
RBC: 4.26 Mil/uL (ref 4.22–5.81)
RDW: 16.4 % — ABNORMAL HIGH (ref 11.5–14.6)

## 2013-02-24 LAB — BASIC METABOLIC PANEL
CO2: 24 mEq/L (ref 19–32)
Chloride: 109 mEq/L (ref 96–112)
Glucose, Bld: 78 mg/dL (ref 70–99)
Potassium: 4.2 mEq/L (ref 3.5–5.1)
Sodium: 141 mEq/L (ref 135–145)

## 2013-06-30 ENCOUNTER — Ambulatory Visit: Payer: Self-pay | Admitting: Neurology

## 2013-07-06 ENCOUNTER — Ambulatory Visit (INDEPENDENT_AMBULATORY_CARE_PROVIDER_SITE_OTHER): Payer: Medicare Other | Admitting: Neurology

## 2013-07-06 ENCOUNTER — Encounter: Payer: Self-pay | Admitting: Neurology

## 2013-07-06 VITALS — BP 137/53 | HR 61 | Ht 69.0 in | Wt 126.0 lb

## 2013-07-06 DIAGNOSIS — R413 Other amnesia: Secondary | ICD-10-CM

## 2013-07-06 DIAGNOSIS — E785 Hyperlipidemia, unspecified: Secondary | ICD-10-CM

## 2013-07-06 NOTE — Progress Notes (Signed)
  History of Present Illness  HPI: Patient is a very pleasant 79 year old male, with past medical history of hyperlipidemia, prostate cancer,  Presenting to my clinic, for short term memory trouble which has been slowly progressive over the past 10 years.   History: He is still fairly active at home, ballroom dancing with his wife regularly, mow his yard, only drive short distance now, wife stating that he easily gets lost. 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  CT of the brain without contrast  showed mild atrophy, lab evaluation has demonstrated normal thyroid function, B12, RPR.  he could not tolerate high dose exelon patch, never tried Aricept in the past.   UPDATE Jul 06 2013;   He is doing very well, they moved to [facility], he wearing hearing aid, no difficulty walking, still pleasant and calm  Review of Systems  Out of a complete 14 system review, the patient complains of only the following symptoms, and all other reviewed systems are negative.  Swelling in legs, hearing loss, memory loss  Social History Pt. has never used Tobacco, Alcohol or Drugs. Pt. quit using caffeine in 1995. Any Tobacco Use: Never used Inhaled Tobacco Use: never smoker  Physical Exam  Neck: supple no carotid bruits Respiratory: clear to auscultation bilaterally Cardiovascular: regular rate rhythm  Neurologic Exam  Mental Status: pleasant, awake, alert, cooperative to history, talking, and casual conversation.MMSE 20/30. He is not oriented to time and place, missed 3/3 recalls, he could not copy design, nor write sentence.  Cranial Nerves: CN II-XII pupils were equal round reactive to light.   Extraocular movements were full.  Visual fields were full on confrontational test.  Facial sensation and strength were normal.  Hearing was intact to finger rubbing bilaterally.  Uvula tongue were midline.  Head turning and shoulder shrugging were normal and symmetric.   Tongue protrusion into the cheeks strength were normal.  Motor: Normal tone, bulk, and strength. Sensory: Normal to light touch,   Coordination:  There was no dysmetria noticed. Gait and Station: Narrow based and steady  Romberg sign: Negative Reflexes: Deep tendon reflexes: present and symmetric   Assessment and Plan: 79 year old gentleman with mild cognitive impairment, slow progression, very pleasant.  1. continue to take Namenda xr 28mg  qday  2.  Aricept 10mg  qam 3. RTC in one year

## 2013-07-27 ENCOUNTER — Telehealth: Payer: Self-pay | Admitting: Neurology

## 2013-07-31 ENCOUNTER — Other Ambulatory Visit: Payer: Self-pay | Admitting: Neurology

## 2013-09-14 ENCOUNTER — Encounter: Payer: Self-pay | Admitting: Internal Medicine

## 2013-09-14 ENCOUNTER — Ambulatory Visit (INDEPENDENT_AMBULATORY_CARE_PROVIDER_SITE_OTHER): Payer: Medicare Other | Admitting: Internal Medicine

## 2013-09-14 VITALS — BP 120/60 | HR 78 | Temp 98.0°F | Resp 14 | Ht 68.75 in | Wt 127.0 lb

## 2013-09-14 DIAGNOSIS — R631 Polydipsia: Secondary | ICD-10-CM

## 2013-09-14 DIAGNOSIS — R946 Abnormal results of thyroid function studies: Secondary | ICD-10-CM

## 2013-09-14 LAB — BASIC METABOLIC PANEL
BUN: 18 mg/dL (ref 6–23)
Calcium: 9.5 mg/dL (ref 8.4–10.5)
Chloride: 107 mEq/L (ref 96–112)
Creatinine, Ser: 0.8 mg/dL (ref 0.4–1.5)

## 2013-09-14 LAB — TSH: TSH: 0.98 u[IU]/mL (ref 0.35–5.50)

## 2013-09-14 NOTE — Progress Notes (Signed)
   Subjective:    Patient ID: Vincent Lee, male    DOB: Jun 25, 1921, 79 y.o.   MRN: [id number]  HPI  Last week he appeared to have excess thirst over several days. This is in the context of chronic increase in ingestion of sweets. His wife questions whether he might be slightly dehydrated due to appearance of his lips and dryness of the lower extremities.  She was unsure as to the status of his weight  In June of this year his glucose and 78. His weight at that time was 121 pounds indicating that he has gained 6 pounds  His last thyroid function test was in 2010  & was 0.58    Review of Systems  At this time there is no polydipsia, polyphagia, or polyuria  He has extensive keratoses and sees dermatologist for some skin cancers. He has no nonhealing skin lesions.  He has no numbness or tingling in extremities.     Objective:   Physical Exam Gen.: Thin but healthy and well-nourished in appearance. Alert and cooperative throughout exam. Wife is historian.Appears younger than stated age  Head: Normocephalic without obvious abnormalities Eyes: No corneal or conjunctival inflammation noted. No icterus Ears: Hearing aids bilaterally. Nose: External nasal exam reveals no deformity or inflammation. Nasal mucosa are pink and moist. No lesions or exudates noted.   Mouth: Oral mucosa and oropharynx reveal no lesions or exudates. Teeth in good repair. Neck: No deformities, masses, or tenderness noted.  Thyroid normal. Lungs: Normal respiratory effort; chest expands symmetrically. Lungs are clear to auscultation without rales, wheezes, or increased work of breathing. Heart: Normal rate and slightly irregular rhythm. Normal S1 and S2. No gallop, click, or rub.  Grade 1/6 systolic murmur Abdomen: Bowel sounds normal; abdomen soft and nontender. No masses, organomegaly or hernias noted.                                 Musculoskeletal/extremities: No deformity or scoliosis noted of  the  thoracic or lumbar spine.   No clubbing, cyanosis, edema, or significant extremity  deformity noted. Range of motion normal .Tone & strength normal. Hand joints normal OR reveal mild  DJD DIP changes. Fingernail health good. Able to lie down & sit up w/o help.  Vascular: Carotid, radial artery, dorsalis pedis and  posterior tibial pulses are full and equal. No bruits present. Neurologic: Alert but not oriented . Unaware of Christmas holiday.Deep tendon reflexes symmetrical and normal.        Skin: Intact without suspicious lesions or rashes.Myriad keratoses Lymph: No cervical, axillary lymphadenopathy present. Psych: Mood and affect are subdued                                                                                       Assessment & Plan:  #1 polydipsia #2 abnormal TFT See order

## 2013-09-14 NOTE — Patient Instructions (Signed)
Your next office appointment will be determined based upon review of your pending labs &/ or x-rays. Those instructions will be transmitted to you  by mail. Followup as needed for your this acute issue. Please report any significant change in your symptoms.

## 2013-09-15 ENCOUNTER — Ambulatory Visit: Payer: Medicare Other | Admitting: Internal Medicine

## 2013-09-15 ENCOUNTER — Encounter: Payer: Self-pay | Admitting: Internal Medicine

## 2013-09-15 ENCOUNTER — Encounter: Payer: Self-pay | Admitting: *Deleted

## 2013-09-28 ENCOUNTER — Telehealth: Payer: Self-pay | Admitting: *Deleted

## 2013-09-28 ENCOUNTER — Other Ambulatory Visit: Payer: Self-pay | Admitting: *Deleted

## 2013-09-28 DIAGNOSIS — R634 Abnormal weight loss: Secondary | ICD-10-CM

## 2013-09-28 DIAGNOSIS — R631 Polydipsia: Secondary | ICD-10-CM

## 2013-09-28 NOTE — Telephone Encounter (Signed)
Patient wife called and stated that dr hopper wanted to re-check her husbands A1c at his next visit but he does not have a next visit. Patient wife states that her husband has been experiencing dry mouth and has been really thirsty. Please advise me to schedule next appointment or if just to do labs.

## 2013-09-28 NOTE — Telephone Encounter (Signed)
Please schedule A1c  Diagnosis: polydipsia;weight loss

## 2013-09-28 NOTE — Telephone Encounter (Signed)
Please advise 

## 2013-10-03 ENCOUNTER — Ambulatory Visit: Payer: Medicare Other

## 2013-10-03 ENCOUNTER — Ambulatory Visit (INDEPENDENT_AMBULATORY_CARE_PROVIDER_SITE_OTHER): Payer: Medicare Other | Admitting: Family Medicine

## 2013-10-03 VITALS — BP 122/60 | HR 86 | Temp 98.0°F | Resp 18 | Ht 68.5 in | Wt 123.0 lb

## 2013-10-03 DIAGNOSIS — R05 Cough: Secondary | ICD-10-CM

## 2013-10-03 DIAGNOSIS — R509 Fever, unspecified: Secondary | ICD-10-CM

## 2013-10-03 DIAGNOSIS — R059 Cough, unspecified: Secondary | ICD-10-CM

## 2013-10-03 LAB — POCT CBC
Granulocyte percent: 50.2 %G (ref 37–80)
HCT, POC: 42.7 % — AB (ref 43.5–53.7)
Hemoglobin: 13.3 g/dL — AB (ref 14.1–18.1)
Lymph, poc: 3.8 — AB (ref 0.6–3.4)
MCH, POC: 29.1 pg (ref 27–31.2)
MCHC: 31.1 g/dL — AB (ref 31.8–35.4)
MCV: 93.4 fL (ref 80–97)
MID (cbc): 2.4 — AB (ref 0–0.9)
MPV: 9.4 fL (ref 0–99.8)
POC Granulocyte: 6.2 (ref 2–6.9)
POC LYMPH PERCENT: 30.5 %L (ref 10–50)
POC MID %: 19.3 %M — AB (ref 0–12)
Platelet Count, POC: 164 10*3/uL (ref 142–424)
RBC: 4.57 M/uL — AB (ref 4.69–6.13)
RDW, POC: 15.6 %
WBC: 12.4 10*3/uL — AB (ref 4.6–10.2)

## 2013-10-03 MED ORDER — LEVOFLOXACIN 500 MG PO TABS: 500.0000 mg | ORAL_TABLET | Freq: Every day | ORAL | Status: DC

## 2013-10-03 NOTE — Progress Notes (Addendum)
Vincent Lee MRN: [id number], DOB: 02-14-1921, 80 y.o. Date of Encounter: 10/03/2013, 10:24 AM  Primary Physician: Unice Cobble, MD  Chief Complaint:  Chief Complaint  Patient presents with  . Fever    x3 nights hx of pneumonia with fever no other sx's     HPI: 80 y.o. year old male presents with a 14 day history of nasal congestion, post nasal drip, sore throat, and cough. Mild sinus pressure. Afebrile. No chills. Nasal congestion thick and green/yellow. Cough is productive of green/yellow sputum and not associated with time of day. Ears feel full, leading to sensation of muffled hearing. Has tried OTC cold preps without success. No GI complaints. Appetite poor  He developed fever Wednesday night which has continued and was 100.7 last night  He has had a pneumonia vaccine, but subsequently developed pneumonia twice. Lives at [facility] No sick contacts, recent antibiotics, or recent travels.   No leg trauma, sedentary periods, h/o cancer, or tobacco use.  Past Medical History  Diagnosis Date  . Alzheimer disease   . Hyperlipidemia   . H/O prostate cancer   . Cataract      Home Meds: Prior to Admission medications   Medication Sig Start Date End Date Taking? Authorizing Provider  donepezil (ARICEPT) 5 MG tablet TAKE 1 TABLET DAILY 07/31/13  Yes Marcial Pacas, MD  Multiple Vitamins-Minerals (MULTIVITAMIN PO) Take by mouth daily.   Yes Historical Provider, MD  NAMENDA 10 MG tablet TAKE 1 TABLET DAILY 07/31/13  Yes Marcial Pacas, MD  donepezil (ARICEPT) 10 MG tablet Take 10 mg by mouth at bedtime.     Historical Provider, MD    Allergies: No Known Allergies  History   Social History  . Marital Status: Married    Spouse Name: N/A    Number of Children: N/A  . Years of Education: N/A   Occupational History  . Not on file.   Social History Main Topics  . Smoking status: Never Smoker   . Smokeless tobacco: Never Used  . Alcohol Use: No  . Drug Use: No  . Sexual  Activity: Not on file   Other Topics Concern  . Not on file   Social History Narrative  . No narrative on file     Review of Systems: Constitutional: negative for chills, fever, night sweats or weight changes Cardiovascular: negative for chest pain or palpitations Respiratory: negative for hemoptysis, wheezing, or shortness of breath Abdominal: negative for abdominal pain, nausea, vomiting or diarrhea Dermatological: negative for rash Neurologic: negative for headache   Physical Exam: Blood pressure 122/60, pulse 86, temperature 98 F (36.7 C), temperature source Oral, resp. rate 18, height 5' 8.5" (1.74 m), weight 123 lb (55.792 kg), SpO2 97.00%., Body mass index is 18.43 kg/(m^2). General: Well developed, well nourished, in no acute distress. Head: Normocephalic, atraumatic, eyes without discharge, sclera non-icteric, nares are congested. Bilateral auditory canals clear, TM's are without perforation, pearly grey with reflective cone of light bilaterally. No sinus TTP. Oral cavity moist, dentition normal. Posterior pharynx with post nasal drip and mild erythema. No peritonsillar abscess or tonsillar exudate. Neck: Supple. No thyromegaly. Full ROM. No lymphadenopathy. Lungs: Coarse breath sounds bilaterally without wheezes, rales, or rhonchi. Breathing is unlabored.  Heart: RRR with S1 S2. No murmurs, rubs, or gallops appreciated. Msk:  Strength and tone normal for age. Extremities: No clubbing or cyanosis. No edema. Neuro: Alert and oriented X 3. Moves all extremities spontaneously. CNII-XII grossly in tact. Psych:  Responds to questions  appropriately with a normal affect.   Labs: Results for orders placed in visit on 10/03/13  POCT CBC      Result Value Range   WBC 12.4 (*) 4.6 - 10.2 K/uL   Lymph, poc 3.8 (*) 0.6 - 3.4   POC LYMPH PERCENT 30.5  10 - 50 %L   MID (cbc) 2.4 (*) 0 - 0.9   POC MID % 19.3 (*) 0 - 12 %M   POC Granulocyte 6.2  2 - 6.9   Granulocyte percent 50.2   37 - 80 %G   RBC 4.57 (*) 4.69 - 6.13 M/uL   Hemoglobin 13.3 (*) 14.1 - 18.1 g/dL   HCT, POC 42.7 (*) 43.5 - 53.7 %   MCV 93.4  80 - 97 fL   MCH, POC 29.1  27 - 31.2 pg   MCHC 31.1 (*) 31.8 - 35.4 g/dL   RDW, POC 15.6     Platelet Count, POC 164  142 - 424 K/uL   MPV 9.4  0 - 99.8 fL   [facility] reading (PRIMARY) by  Dr. Joseph Art. Small RLL infiltrate    ASSESSMENT AND PLAN:  80 y.o. year old male with bronchitis. - -Tylenol/Motrin prn -Rest/fluids -RTC precautions -RTC 3-5 days if no improvement  Signed, Robyn Haber, MD 10/03/2013 10:24 AM

## 2013-10-03 NOTE — Patient Instructions (Signed)

## 2013-10-05 ENCOUNTER — Telehealth: Payer: Self-pay

## 2013-10-05 NOTE — Telephone Encounter (Signed)
Spoke to pt wife. States he is doing better. He ate lunch today. He seems to be running a fever just at night. She is going to try to bring him in tomorrow morning. She will try to bring him tomorrow unless he is doing a lot better she will still follow up on Saturday with Dr. Carlean Jews

## 2013-10-05 NOTE — Telephone Encounter (Signed)
Pt's wife called and said that he is not feeling better and has no energy. She would like to speak with someone. Please call [phone]

## 2013-10-07 ENCOUNTER — Telehealth: Payer: Self-pay | Admitting: *Deleted

## 2013-10-10 ENCOUNTER — Ambulatory Visit: Payer: Medicare Other

## 2013-10-10 ENCOUNTER — Ambulatory Visit (INDEPENDENT_AMBULATORY_CARE_PROVIDER_SITE_OTHER): Payer: Medicare Other | Admitting: Family Medicine

## 2013-10-10 VITALS — BP 122/66 | HR 65 | Temp 98.3°F | Resp 16 | Ht 68.5 in | Wt 123.0 lb

## 2013-10-10 DIAGNOSIS — R5381 Other malaise: Secondary | ICD-10-CM

## 2013-10-10 DIAGNOSIS — R630 Anorexia: Secondary | ICD-10-CM

## 2013-10-10 DIAGNOSIS — J159 Unspecified bacterial pneumonia: Secondary | ICD-10-CM

## 2013-10-10 DIAGNOSIS — R5383 Other fatigue: Principal | ICD-10-CM

## 2013-10-10 DIAGNOSIS — R059 Cough, unspecified: Secondary | ICD-10-CM

## 2013-10-10 DIAGNOSIS — R05 Cough: Secondary | ICD-10-CM

## 2013-10-10 LAB — POCT CBC
Granulocyte percent: 47.4 %G (ref 37–80)
HCT, POC: 39.9 % — AB (ref 43.5–53.7)
Hemoglobin: 12.6 g/dL — AB (ref 14.1–18.1)
Lymph, poc: 2.9 (ref 0.6–3.4)
MCH, POC: 29.4 pg (ref 27–31.2)
MCHC: 31.6 g/dL — AB (ref 31.8–35.4)
MCV: 93.2 fL (ref 80–97)
MID (cbc): 1.9 — AB (ref 0–0.9)
MPV: 10.2 fL (ref 0–99.8)
POC Granulocyte: 4.4 (ref 2–6.9)
POC LYMPH PERCENT: 31.8 %L (ref 10–50)
POC MID %: 20.8 %M — AB (ref 0–12)
Platelet Count, POC: 159 10*3/uL (ref 142–424)
RBC: 4.28 M/uL — AB (ref 4.69–6.13)
RDW, POC: 15.2 %
WBC: 9.2 10*3/uL (ref 4.6–10.2)

## 2013-10-10 MED ORDER — METHYLPREDNISOLONE ACETATE 80 MG/ML IJ SUSP
80.0000 mg | Freq: Once | INTRAMUSCULAR | Status: AC
  Administered 2013-10-10: 80 mg via INTRAMUSCULAR

## 2013-10-10 NOTE — Progress Notes (Signed)
80 yo married gentleman from [facility] who has remained listless for the past week.  He did take his antibiotics as directed after evaluation last week suggested pneumonia.  Appetite is horrible.  Objective:  NAD Hx obtained from wife.  Patient is alert and cooperative HEENT:  unremarkable Chest:  Clear Heart:  3/6 systolic murmur consistent with aortic valve stenosis Extremities: trace edema General color:  Good [facility] reading (PRIMARY) by  Dr. Joseph Art.  CXR:  Some clearing, ongoing diffuse scarring Results for orders placed in visit on 10/10/13  POCT CBC      Result Value Range   WBC 9.2  4.6 - 10.2 K/uL   Lymph, poc 2.9  0.6 - 3.4   POC LYMPH PERCENT 31.8  10 - 50 %L   MID (cbc) 1.9 (*) 0 - 0.9   POC MID % 20.8 (*) 0 - 12 %M   POC Granulocyte 4.4  2 - 6.9   Granulocyte percent 47.4  37 - 80 %G   RBC 4.28 (*) 4.69 - 6.13 M/uL   Hemoglobin 12.6 (*) 14.1 - 18.1 g/dL   HCT, POC 39.9 (*) 43.5 - 53.7 %   MCV 93.2  80 - 97 fL   MCH, POC 29.4  27 - 31.2 pg   MCHC 31.6 (*) 31.8 - 35.4 g/dL   RDW, POC 15.2     Platelet Count, POC 159  142 - 424 K/uL   MPV 10.2  0 - 99.8 fL    Assessment:  COPD with improvement over last week  Plan:  DepoMedrol 80 IM Other malaise and fatigue - Plan: POCT CBC, DG Chest 2 View, methylPREDNISolone acetate (DEPO-MEDROL) injection 80 mg  Loss of appetite - Plan: POCT CBC, DG Chest 2 View, methylPREDNISolone acetate (DEPO-MEDROL) injection 80 mg  Cough - Plan: POCT CBC, DG Chest 2 View, methylPREDNISolone acetate (DEPO-MEDROL) injection 80 mg  Signed, Robyn Haber, MD

## 2013-10-12 ENCOUNTER — Other Ambulatory Visit: Payer: Medicare Other

## 2013-10-14 ENCOUNTER — Telehealth: Payer: Self-pay | Admitting: *Deleted

## 2013-10-14 ENCOUNTER — Emergency Department (HOSPITAL_COMMUNITY): Payer: Medicare Other

## 2013-10-14 ENCOUNTER — Emergency Department (INDEPENDENT_AMBULATORY_CARE_PROVIDER_SITE_OTHER): Payer: Medicare Other

## 2013-10-14 ENCOUNTER — Encounter (HOSPITAL_COMMUNITY): Payer: Self-pay | Admitting: Emergency Medicine

## 2013-10-14 ENCOUNTER — Emergency Department (INDEPENDENT_AMBULATORY_CARE_PROVIDER_SITE_OTHER)
Admission: EM | Admit: 2013-10-14 | Discharge: 2013-10-14 | Disposition: A | Discharge status: Home / Self Care | Payer: Medicare Other | Source: Home / Self Care | Attending: Family Medicine | Admitting: Family Medicine

## 2013-10-14 DIAGNOSIS — E860 Dehydration: Secondary | ICD-10-CM

## 2013-10-14 DIAGNOSIS — R7309 Other abnormal glucose: Secondary | ICD-10-CM

## 2013-10-14 DIAGNOSIS — R739 Hyperglycemia, unspecified: Secondary | ICD-10-CM

## 2013-10-14 LAB — CBC WITH DIFFERENTIAL/PLATELET
BASOS ABS: 0 10*3/uL (ref 0.0–0.1)
Basophils Relative: 0 % (ref 0–1)
EOS ABS: 0 10*3/uL (ref 0.0–0.7)
EOS PCT: 0 % (ref 0–5)
HEMATOCRIT: 40.5 % (ref 39.0–52.0)
Hemoglobin: 13.6 g/dL (ref 13.0–17.0)
LYMPHS PCT: 36 % (ref 12–46)
Lymphs Abs: 3.6 10*3/uL (ref 0.7–4.0)
MCH: 29.8 pg (ref 26.0–34.0)
MCHC: 33.6 g/dL (ref 30.0–36.0)
MCV: 88.6 fL (ref 78.0–100.0)
MONO ABS: 3.4 10*3/uL — AB (ref 0.1–1.0)
Monocytes Relative: 35 % — ABNORMAL HIGH (ref 3–12)
Neutro Abs: 2.9 10*3/uL (ref 1.7–7.7)
Neutrophils Relative %: 29 % — ABNORMAL LOW (ref 43–77)
PLATELETS: 134 10*3/uL — AB (ref 150–400)
RBC: 4.57 MIL/uL (ref 4.22–5.81)
RDW: 15.8 % — AB (ref 11.5–15.5)
WBC: 9.9 10*3/uL (ref 4.0–10.5)

## 2013-10-14 LAB — POCT I-STAT, CHEM 8
BUN: 24 mg/dL — AB (ref 6–23)
CALCIUM ION: 1.27 mmol/L (ref 1.13–1.30)
CHLORIDE: 103 meq/L (ref 96–112)
CREATININE: 1.1 mg/dL (ref 0.50–1.35)
GLUCOSE: 190 mg/dL — AB (ref 70–99)
HCT: 42 % (ref 39.0–52.0)
Hemoglobin: 14.3 g/dL (ref 13.0–17.0)
Potassium: 4.3 mEq/L (ref 3.7–5.3)
Sodium: 141 mEq/L (ref 137–147)
TCO2: 26 mmol/L (ref 0–100)

## 2013-10-14 LAB — POCT URINALYSIS DIP (DEVICE)
Bilirubin Urine: NEGATIVE
GLUCOSE, UA: NEGATIVE mg/dL
HGB URINE DIPSTICK: NEGATIVE
Ketones, ur: NEGATIVE mg/dL
Leukocytes, UA: NEGATIVE
Nitrite: NEGATIVE
PH: 5.5 (ref 5.0–8.0)
Protein, ur: 30 mg/dL — AB
UROBILINOGEN UA: 0.2 mg/dL (ref 0.0–1.0)

## 2013-10-14 NOTE — Telephone Encounter (Signed)
Patient's wife called and stated that she would like for him to come in tomorrow, if possible, to have his A1c checked. She stated that his mouth stays dry and he is becoming more lethargic during the day. Please advise.

## 2013-10-14 NOTE — ED Notes (Signed)
Pt  Was  Seen  Recently  X  2  For  pnuemonia   He    Continues  To  Cough     And  Feel  Weak  as  Completed  A  Course  Of  Anti  Biotics   And  A  Course  Of prednisone  As  Well         He  Has  Some  Slight  Dementia  As  Well     Wife  Is  At  Bedside

## 2013-10-14 NOTE — ED Provider Notes (Signed)
CSN: [id number]     Arrival date & time 10/14/13  0932 History   First MD Initiated Contact with Patient 10/14/13 1105     Chief Complaint  Patient presents with  . Follow-up   (Consider location/radiation/quality/duration/timing/severity/associated sxs/prior Treatment) HPI Comments: Patient is brought to UC by his wife who feels that her husband (the patient) continues with fatigue and decreased appetite following recent treatment for pneumonia. Wife provides much of history as patient suffers from dementia. Wife states patient began with cough and cold symptoms about 4 weeks ago. Two weeks after initial symptoms began, he developed fever and she then took him to [facility] where he was diagnosed with pneumonia and treated with a 10 day course of Levaquin that finished on 10/12/2013. On 10/10/2013, she had patient re-evaluated at [facility] because he was still with diminished energy level, and she reports he was given "a shot of prednisone." She comes here today because while she feels he is slowly improving, he is "not bouncing back like he should." When patient is asked how he is feeling he states that he feels fine.   The history is provided by the patient and the spouse.    Past Medical History  Diagnosis Date  . Alzheimer disease   . Hyperlipidemia   . H/O prostate cancer   . Cataract    Past Surgical History  Procedure Laterality Date  . High dose radiation implant insertion      for prostate ca  . Prostate surgery     Family History  Problem Relation Age of Onset  . Cancer Brother      ? lung  . Diabetes Brother     not definite  . Heart disease Neg Hx   . Stroke Neg Hx   . Dementia Mother    History  Substance Use Topics  . Smoking status: Never Smoker   . Smokeless tobacco: Never Used  . Alcohol Use: No    Review of Systems  Constitutional: Positive for activity change, appetite change and fatigue. Negative for fever, chills, diaphoresis  and unexpected weight change.  HENT: Positive for congestion and rhinorrhea. Negative for sore throat and trouble swallowing.   Eyes: Negative.   Respiratory: Positive for cough. Negative for choking, chest tightness, shortness of breath, wheezing and stridor.   Cardiovascular: Negative.   Gastrointestinal: Negative.   Endocrine: Positive for polydipsia. Negative for polyphagia and polyuria.  Genitourinary: Negative.   Musculoskeletal: Negative.   Skin: Negative.   Allergic/Immunologic: Negative for immunocompromised state.  Neurological: Negative.   Hematological: Negative for adenopathy.  Psychiatric/Behavioral: Negative.     Allergies  Review of patient's allergies indicates no known allergies.  Home Medications   Current Outpatient Rx  Name  Route  Sig  Dispense  Refill  . donepezil (ARICEPT) 10 MG tablet   Oral   Take 10 mg by mouth at bedtime.          . donepezil (ARICEPT) 5 MG tablet      TAKE 1 TABLET DAILY   100 tablet   2   . levofloxacin (LEVAQUIN) 500 MG tablet   Oral   Take 1 tablet (500 mg total) by mouth daily.   10 tablet   0   . Multiple Vitamins-Minerals (MULTIVITAMIN PO)   Oral   Take by mouth daily.         Marland Kitchen NAMENDA 10 MG tablet      TAKE 1 TABLET DAILY   100  tablet   2    BP 170/88  Pulse 78  Temp(Src) 99.6 F (37.6 C) (Oral)  Resp 18  SpO2 97% Physical Exam  Nursing note and vitals reviewed. Constitutional: He appears well-developed and well-nourished. No distress.  HENT:  Head: Normocephalic and atraumatic.  Nose: Nose normal.  Mouth/Throat: Oropharynx is clear and moist.  Eyes: Conjunctivae are normal. Right eye exhibits no discharge. Left eye exhibits no discharge. No scleral icterus.  Neck: Normal range of motion. Neck supple.  Cardiovascular: Normal rate, regular rhythm and normal heart sounds.   Pulmonary/Chest: Effort normal and breath sounds normal. No respiratory distress. He has no wheezes. He has no rales. He  exhibits no tenderness.  Abdominal: Soft. Bowel sounds are normal. He exhibits no distension. There is no tenderness.  Musculoskeletal: Normal range of motion.  Lymphadenopathy:    He has no cervical adenopathy.  Neurological: He is alert. No cranial nerve deficit. Coordination normal.  Skin: Skin is warm and dry. No rash noted.  Psychiatric: He has a normal mood and affect. His behavior is normal.    ED Course  Procedures (including critical care time) Labs Review Labs Reviewed  POCT I-STAT, CHEM 8 - Abnormal; Notable for the following:    BUN 24 (*)    Glucose, Bld 190 (*)    All other components within normal limits  POCT URINALYSIS DIP (DEVICE) - Abnormal; Notable for the following:    Protein, ur 30 (*)    All other components within normal limits  CBC WITH DIFFERENTIAL   Imaging Review Dg Chest 2 View  10/14/2013   CLINICAL DATA:  Cough.  EXAM: CHEST  2 VIEW  COMPARISON:  PA and lateral chest 10/10/2013 per 07/23/2013 and 09/20/2012.  FINDINGS: The lungs appear emphysematous. Are some prominent interstitial markings in the bases which are unchanged back to the 2013 exam. No consolidative process, pneumothorax or effusion is identified. Heart size is normal.  IMPRESSION: Emphysema without acute disease.   Electronically Signed   By: Inge Rise M.D.   On: 10/14/2013 12:09    EKG Interpretation    Date/Time:    Ventricular Rate:    PR Interval:    QRS Duration:   QT Interval:    QTC Calculation:   R Axis:     Text Interpretation:              MDM  CXR: unremarkable for residual or worsening pneumonia UA: consistent with dehydration, no infection Istat: elevated BUN suggesting dehydration and elevated BG which would contribute to dehydration and help to explain patient's sensation of dry mouth and thirst. CBC: reasonably normal given patient's age.  Discussed these results with patient's wife and voices understanding and agrees to help encourage better  hydration at home and to contact Dr. Clayborn Heron office this afternoon to arrange follow up evaluation for patient in next 1-2 days.     [place], [place] 10/14/13 1243

## 2013-10-14 NOTE — ED Provider Notes (Signed)
Medical screening examination/treatment/procedure(s) were performed by a resident physician or non-physician practitioner and as the supervising physician I was immediately available for consultation/collaboration.  Lynne Leader, MD    Gregor Hams, MD 10/14/13 [phone]

## 2013-10-14 NOTE — Telephone Encounter (Signed)
Order in.

## 2013-10-14 NOTE — Discharge Instructions (Signed)
Dehydration, Adult °Dehydration is when you lose more fluids from the body than you take in. Vital organs like the kidneys, brain, and heart cannot function without a proper amount of fluids and salt. Any loss of fluids from the body can cause dehydration.  °CAUSES  °· Vomiting. °· Diarrhea. °· Excessive sweating. °· Excessive urine output. °· Fever. °SYMPTOMS  °Mild dehydration °· Thirst. °· Dry lips. °· Slightly dry mouth. °Moderate dehydration °· Very dry mouth. °· Sunken eyes. °· Skin does not bounce back quickly when lightly pinched and released. °· Dark urine and decreased urine production. °· Decreased tear production. °· Headache. °Severe dehydration °· Very dry mouth. °· Extreme thirst. °· Rapid, weak pulse (more than 100 beats per minute at rest). °· Cold hands and feet. °· Not able to sweat in spite of heat and temperature. °· Rapid breathing. °· Blue lips. °· Confusion and lethargy. °· Difficulty being awakened. °· Minimal urine production. °· No tears. °DIAGNOSIS  °Your caregiver will diagnose dehydration based on your symptoms and your exam. Blood and urine tests will help confirm the diagnosis. The diagnostic evaluation should also identify the cause of dehydration. °TREATMENT  °Treatment of mild or moderate dehydration can often be done at home by increasing the amount of fluids that you drink. It is best to drink small amounts of fluid more often. Drinking too much at one time can make vomiting worse. Refer to the home care instructions below. °Severe dehydration needs to be treated at the hospital where you will probably be given intravenous (IV) fluids that contain water and electrolytes. °HOME CARE INSTRUCTIONS  °· Ask your caregiver about specific rehydration instructions. °· Drink enough fluids to keep your urine clear or pale yellow. °· Drink small amounts frequently if you have nausea and vomiting. °· Eat as you normally do. °· Avoid: °· Foods or drinks high in sugar. °· Carbonated  drinks. °· Juice. °· Extremely hot or cold fluids. °· Drinks with caffeine. °· Fatty, greasy foods. °· Alcohol. °· Tobacco. °· Overeating. °· Gelatin desserts. °· Wash your hands well to avoid spreading bacteria and viruses. °· Only take over-the-counter or prescription medicines for pain, discomfort, or fever as directed by your caregiver. °· Ask your caregiver if you should continue all prescribed and over-the-counter medicines. °· Keep all follow-up appointments with your caregiver. °SEEK MEDICAL CARE IF: °· You have abdominal pain and it increases or stays in one area (localizes). °· You have a rash, stiff neck, or severe headache. °· You are irritable, sleepy, or difficult to awaken. °· You are weak, dizzy, or extremely thirsty. °SEEK IMMEDIATE MEDICAL CARE IF:  °· You are unable to keep fluids down or you get worse despite treatment. °· You have frequent episodes of vomiting or diarrhea. °· You have blood or green matter (bile) in your vomit. °· You have blood in your stool or your stool looks black and tarry. °· You have not urinated in 6 to 8 hours, or you have only urinated a small amount of very dark urine. °· You have a fever. °· You faint. °MAKE SURE YOU:  °· Understand these instructions. °· Will watch your condition. °· Will get help right away if you are not doing well or get worse. °Document Released: 09/10/2005 Document Revised: 12/03/2011 Document Reviewed: 04/30/2011 °ExitCare® Patient Information ©2014 [employer]. ° °Dehydration, Elderly °Dehydration is when you lose more fluids from the body than you take in. Vital organs such as the kidneys, brain, and heart cannot function   without a proper amount of fluids and salt. Any loss of fluids from the body can cause dehydration.  °Older adults are at a higher risk of dehydration than younger adults. As we age, our bodies are less able to conserve water and do not respond to temperature changes as well. Also, older adults do not become thirsty as  easily or quickly. Because of this, older adults often do not realize they need to increase fluids to avoid dehydration.  °CAUSES  °· Vomiting. °· Diarrhea. °· Excessive sweating. °· Excessive urination. °· Fever. °· Certain medicines, such as blood pressure medicines called diuretics. °· Poorly controlled blood sugars. °SIGNS AND SYMPTOMS  °Mild dehydration: °· Thirst. °· Dry lips. °· Slightly dry mouth. °Moderate dehydration: °· Very dry mouth. °· Sunken eyes. °· Skin does not bounce back quickly when lightly pinched and released. °· Dark urine and decreased urine production. °· Decreased tear production. °· Headache. °Severe dehydration: °· Very dry mouth. °· Extreme thirst. °· Rapid, weak pulse (more than 100 beats per minute at rest). °· Cold hands and feet. °· Not able to sweat in spite of heat. °· Rapid breathing. °· Blue lips. °· Confusion and lethargy. °· Difficulty being awakened. °· Minimal urine production. °· No tears. °DIAGNOSIS  °Your health care provider will diagnose dehydration based on your symptoms and your exam. Blood and urine tests will help confirm the diagnosis. The diagnostic evaluation should also identify the cause of dehydration. °TREATMENT  °Treatment of mild or moderate dehydration can often be done at home by increasing the amount of fluids that you drink. It is best to drink small amounts of fluid more often. Drinking too much at one time can make vomiting worse. Severe dehydration needs to be treated at the hospital. You may be given IV fluids that contain water and electrolytes. °HOME CARE INSTRUCTIONS  °· Ask your health care provider about specific rehydration instructions. °· Drink enough fluids to keep your urine clear or pale yellow. °· Drink small amounts frequently if you have nausea and vomiting. °· Eat as you normally do. °· Avoid: °· Foods or drinks high in sugar. °· Carbonated drinks. °· Juice. °· Extremely hot or cold fluids. °· Drinks with caffeine. °· Fatty, greasy  foods. °· Alcohol. °· Tobacco. °· Overeating. °· Gelatin desserts. °· Wash your hands well to avoid spreading bacteria and viruses. °· Only take over-the-counter or prescription medicines for pain, discomfort, or fever as directed by your health care provider. °· Ask your health care provider if you should continue all prescribed and over-the-counter medicines. °· Keep all follow-up appointments with your health care provider. °SEEK MEDICAL CARE IF: °· You have abdominal pain, and it increases or stays in one area (localizes). °· You have a rash, stiff neck, or severe headache. °· You are irritable, sleepy, or difficult to awaken. °· You are weak, dizzy, or extremely thirsty. °SEEK IMMEDIATE MEDICAL CARE IF:  °· You are unable to keep fluids down, or you get worse despite treatment. °· You have frequent episodes of vomiting or diarrhea. °· You have blood or green matter (bile) in your vomit. °· You have blood in your stool, or your stool looks black and tarry. °· You have not urinated in 6 8 hours, or you have only urinated a small amount of very dark urine. °· You have a fever. °· You faint. °MAKE SURE YOU:  °· Understand these instructions. °· Will watch your condition. °· Will get help right away if you are   not doing well or get worse. °Document Released: 12/01/2003 Document Revised: 07/01/2013 Document Reviewed: 05/18/2013 °ExitCare® Patient Information ©2014 [employer]. ° °

## 2013-10-14 NOTE — Telephone Encounter (Signed)
Called and scheduled lab appointment. JG//CMA

## 2013-10-15 ENCOUNTER — Telehealth: Payer: Self-pay | Admitting: *Deleted

## 2013-10-15 ENCOUNTER — Other Ambulatory Visit (INDEPENDENT_AMBULATORY_CARE_PROVIDER_SITE_OTHER): Payer: Medicare Other

## 2013-10-15 DIAGNOSIS — R631 Polydipsia: Secondary | ICD-10-CM

## 2013-10-15 DIAGNOSIS — R634 Abnormal weight loss: Secondary | ICD-10-CM

## 2013-10-15 LAB — HEMOGLOBIN A1C: Hgb A1c MFr Bld: 6.1 % (ref 4.6–6.5)

## 2013-10-15 NOTE — Telephone Encounter (Signed)
Per Dr. Linna Darner pt does not need a urine test.  Only need HbgA1c.//AB/CMA

## 2013-10-15 NOTE — Telephone Encounter (Signed)
Patient called and stated he wanted to have his urine tested also when he came in today at 10:30 for his lab apt. Please advise.

## 2013-10-17 ENCOUNTER — Inpatient Hospital Stay (HOSPITAL_COMMUNITY)
Admission: EM | Admit: 2013-10-17 | Discharge: 2013-10-22 | DRG: 190 | Disposition: A | Discharge status: Skilled Nursing Facility | Payer: Medicare Other | Attending: Family Medicine | Admitting: Family Medicine

## 2013-10-17 ENCOUNTER — Emergency Department (HOSPITAL_COMMUNITY): Payer: Medicare Other

## 2013-10-17 ENCOUNTER — Encounter (HOSPITAL_COMMUNITY): Payer: Self-pay | Admitting: *Deleted

## 2013-10-17 DIAGNOSIS — Z8673 Personal history of transient ischemic attack (TIA), and cerebral infarction without residual deficits: Secondary | ICD-10-CM

## 2013-10-17 DIAGNOSIS — R413 Other amnesia: Secondary | ICD-10-CM

## 2013-10-17 DIAGNOSIS — E2740 Unspecified adrenocortical insufficiency: Secondary | ICD-10-CM

## 2013-10-17 DIAGNOSIS — G309 Alzheimer's disease, unspecified: Secondary | ICD-10-CM | POA: Diagnosis present

## 2013-10-17 DIAGNOSIS — J441 Chronic obstructive pulmonary disease with (acute) exacerbation: Principal | ICD-10-CM | POA: Diagnosis present

## 2013-10-17 DIAGNOSIS — Z79899 Other long term (current) drug therapy: Secondary | ICD-10-CM

## 2013-10-17 DIAGNOSIS — E785 Hyperlipidemia, unspecified: Secondary | ICD-10-CM | POA: Diagnosis present

## 2013-10-17 DIAGNOSIS — J1100 Influenza due to unidentified influenza virus with unspecified type of pneumonia: Secondary | ICD-10-CM | POA: Diagnosis present

## 2013-10-17 DIAGNOSIS — R531 Weakness: Secondary | ICD-10-CM

## 2013-10-17 DIAGNOSIS — D649 Anemia, unspecified: Secondary | ICD-10-CM | POA: Diagnosis present

## 2013-10-17 DIAGNOSIS — F0280 Dementia in other diseases classified elsewhere without behavioral disturbance: Secondary | ICD-10-CM | POA: Diagnosis present

## 2013-10-17 DIAGNOSIS — Z8546 Personal history of malignant neoplasm of prostate: Secondary | ICD-10-CM

## 2013-10-17 DIAGNOSIS — J189 Pneumonia, unspecified organism: Secondary | ICD-10-CM

## 2013-10-17 DIAGNOSIS — E2749 Other adrenocortical insufficiency: Secondary | ICD-10-CM | POA: Diagnosis present

## 2013-10-17 DIAGNOSIS — F0390 Unspecified dementia without behavioral disturbance: Secondary | ICD-10-CM

## 2013-10-17 DIAGNOSIS — J111 Influenza due to unidentified influenza virus with other respiratory manifestations: Secondary | ICD-10-CM

## 2013-10-17 DIAGNOSIS — D696 Thrombocytopenia, unspecified: Secondary | ICD-10-CM | POA: Diagnosis present

## 2013-10-17 HISTORY — DX: Unspecified malignant neoplasm of skin, unspecified: C44.90

## 2013-10-17 LAB — COMPREHENSIVE METABOLIC PANEL
ALT: 12 U/L (ref 0–53)
AST: 40 U/L — AB (ref 0–37)
Albumin: 3.1 g/dL — ABNORMAL LOW (ref 3.5–5.2)
Alkaline Phosphatase: 78 U/L (ref 39–117)
BUN: 25 mg/dL — ABNORMAL HIGH (ref 6–23)
CALCIUM: 8.8 mg/dL (ref 8.4–10.5)
CO2: 22 mEq/L (ref 19–32)
CREATININE: 0.85 mg/dL (ref 0.50–1.35)
Chloride: 103 mEq/L (ref 96–112)
GFR calc Af Amer: 86 mL/min — ABNORMAL LOW (ref >90)
GFR calc non Af Amer: 74 mL/min — ABNORMAL LOW (ref >90)
Glucose, Bld: 145 mg/dL — ABNORMAL HIGH (ref 70–99)
Potassium: 4.7 mEq/L (ref 3.7–5.3)
Sodium: 139 mEq/L (ref 137–147)
TOTAL PROTEIN: 5.8 g/dL — AB (ref 6.0–8.3)
Total Bilirubin: 0.9 mg/dL (ref 0.3–1.2)

## 2013-10-17 LAB — CBC WITH DIFFERENTIAL/PLATELET
BAND NEUTROPHILS: 0 % (ref 0–10)
BASOS PCT: 0 % (ref 0–1)
Basophils Absolute: 0 10*3/uL (ref 0.0–0.1)
Blasts: 0 %
Eosinophils Absolute: 0.1 10*3/uL (ref 0.0–0.7)
Eosinophils Relative: 1 % (ref 0–5)
HEMATOCRIT: 37.9 % — AB (ref 39.0–52.0)
Hemoglobin: 13.2 g/dL (ref 13.0–17.0)
LYMPHS ABS: 1.6 10*3/uL (ref 0.7–4.0)
LYMPHS PCT: 15 % (ref 12–46)
MCH: 29.7 pg (ref 26.0–34.0)
MCHC: 34.8 g/dL (ref 30.0–36.0)
MCV: 85.4 fL (ref 78.0–100.0)
METAMYELOCYTES PCT: 0 %
Monocytes Absolute: 4.8 10*3/uL — ABNORMAL HIGH (ref 0.1–1.0)
Monocytes Relative: 45 % — ABNORMAL HIGH (ref 3–12)
Myelocytes: 0 %
Neutro Abs: 4.2 10*3/uL (ref 1.7–7.7)
Neutrophils Relative %: 39 % — ABNORMAL LOW (ref 43–77)
Platelets: 82 10*3/uL — ABNORMAL LOW (ref 150–400)
Promyelocytes Absolute: 0 %
RBC: 4.44 MIL/uL (ref 4.22–5.81)
RDW: 15.9 % — ABNORMAL HIGH (ref 11.5–15.5)
WBC: 10.7 10*3/uL — ABNORMAL HIGH (ref 4.0–10.5)
nRBC: 0 /100 WBC

## 2013-10-17 LAB — URINALYSIS, ROUTINE W REFLEX MICROSCOPIC
Bilirubin Urine: NEGATIVE
Glucose, UA: NEGATIVE mg/dL
Hgb urine dipstick: NEGATIVE
Ketones, ur: NEGATIVE mg/dL
LEUKOCYTES UA: NEGATIVE
Nitrite: NEGATIVE
PH: 5 (ref 5.0–8.0)
Protein, ur: 30 mg/dL — AB
SPECIFIC GRAVITY, URINE: 1.028 (ref 1.005–1.030)
Urobilinogen, UA: 0.2 mg/dL (ref 0.0–1.0)

## 2013-10-17 LAB — TROPONIN I: Troponin I: <0.3 ng/mL (ref <0.30)

## 2013-10-17 LAB — URINE MICROSCOPIC-ADD ON

## 2013-10-17 LAB — GLUCOSE, CAPILLARY: Glucose-Capillary: 167 mg/dL — ABNORMAL HIGH (ref 70–99)

## 2013-10-17 LAB — PRO B NATRIURETIC PEPTIDE: PRO B NATRI PEPTIDE: 304.2 pg/mL (ref 0–450)

## 2013-10-17 LAB — INFLUENZA PANEL BY PCR (TYPE A & B)
H1N1FLUPCR: NOT DETECTED
Influenza A By PCR: POSITIVE — AB
Influenza B By PCR: NEGATIVE

## 2013-10-17 MED ORDER — OSELTAMIVIR PHOSPHATE 75 MG PO CAPS
75.0000 mg | ORAL_CAPSULE | Freq: Two times a day (BID) | ORAL | Status: DC
  Administered 2013-10-17: 75 mg via ORAL
  Filled 2013-10-17 (×2): qty 1

## 2013-10-17 MED ORDER — DEXTROSE 5 % IV SOLN
500.0000 mg | Freq: Once | INTRAVENOUS | Status: AC
  Administered 2013-10-17: 500 mg via INTRAVENOUS

## 2013-10-17 MED ORDER — ACETAMINOPHEN 650 MG RE SUPP
650.0000 mg | Freq: Four times a day (QID) | RECTAL | Status: DC | PRN
Start: 2013-10-17 — End: 2013-10-22

## 2013-10-17 MED ORDER — BENZONATATE 100 MG PO CAPS
200.0000 mg | ORAL_CAPSULE | Freq: Three times a day (TID) | ORAL | Status: DC | PRN
  Administered 2013-10-17 – 2013-10-18 (×2): 200 mg via ORAL
  Filled 2013-10-17 (×3): qty 2

## 2013-10-17 MED ORDER — OSELTAMIVIR PHOSPHATE 30 MG PO CAPS
30.0000 mg | ORAL_CAPSULE | Freq: Two times a day (BID) | ORAL | Status: DC
  Administered 2013-10-18 (×2): 30 mg via ORAL
  Filled 2013-10-17 (×4): qty 1

## 2013-10-17 MED ORDER — IPRATROPIUM-ALBUTEROL 0.5-2.5 (3) MG/3ML IN SOLN
3.0000 mL | RESPIRATORY_TRACT | Status: DC
  Administered 2013-10-17: 3 mL via RESPIRATORY_TRACT
  Filled 2013-10-17 (×2): qty 3

## 2013-10-17 MED ORDER — ACETAMINOPHEN 325 MG PO TABS: 650.0000 mg | ORAL_TABLET | Freq: Four times a day (QID) | ORAL | Status: DC | PRN

## 2013-10-17 MED ORDER — DEXTROSE 5 % IV SOLN
1.0000 g | Freq: Once | INTRAVENOUS | Status: AC
  Administered 2013-10-17: 1 g via INTRAVENOUS
  Filled 2013-10-17: qty 10

## 2013-10-17 MED ORDER — SODIUM CHLORIDE 0.9 % IV SOLN
INTRAVENOUS | Status: DC
  Administered 2013-10-17: 75 mL/h via INTRAVENOUS

## 2013-10-17 MED ORDER — DONEPEZIL HCL 5 MG PO TABS
5.0000 mg | ORAL_TABLET | Freq: Every day | ORAL | Status: DC
  Administered 2013-10-17 – 2013-10-21 (×5): 5 mg via ORAL
  Filled 2013-10-17 (×6): qty 1

## 2013-10-17 MED ORDER — VANCOMYCIN HCL 500 MG IV SOLR
500.0000 mg | Freq: Two times a day (BID) | INTRAVENOUS | Status: DC
  Administered 2013-10-18 – 2013-10-20 (×6): 500 mg via INTRAVENOUS
  Filled 2013-10-17 (×6): qty 500

## 2013-10-17 MED ORDER — SODIUM CHLORIDE 0.9 % IV SOLN
INTRAVENOUS | Status: AC
  Administered 2013-10-18: 07:00:00 via INTRAVENOUS

## 2013-10-17 MED ORDER — PIPERACILLIN-TAZOBACTAM 3.375 G IVPB
3.3750 g | Freq: Three times a day (TID) | INTRAVENOUS | Status: DC
  Administered 2013-10-18 – 2013-10-21 (×11): 3.375 g via INTRAVENOUS
  Filled 2013-10-17 (×15): qty 50

## 2013-10-17 MED ORDER — MEMANTINE HCL 10 MG PO TABS
10.0000 mg | ORAL_TABLET | Freq: Every day | ORAL | Status: DC
  Administered 2013-10-18 – 2013-10-22 (×5): 10 mg via ORAL
  Filled 2013-10-17 (×5): qty 1

## 2013-10-17 NOTE — ED Provider Notes (Signed)
CSN: [id number]     Arrival date & time 10/17/13  3557 History   First MD Initiated Contact with Patient 10/17/13 [phone]     Chief Complaint  Patient presents with  . Weakness  . Cough  . Pneumonia   (Consider location/radiation/quality/duration/timing/severity/associated sxs/prior Treatment) HPI Comments: Patient brought to ED by his wife who feels that he is weaker, not eating and has decreased energy level over the past several days. He was seen at urgent care on January 10 and diagnosed with pneumonia and completed a 10 day course of Levaquin. He was seen again in urgent care last week for weakness and was told he was dehydrated. Wife reports she's become unsteady on his feet he is not eating and drinking he still has a moist cough. Patient is in no distress and denies complaints. He denies any chest pain or shortness of breath. Not had any fevers at home. No vomiting. Poor by mouth intake today.  The history is provided by the patient and the spouse.    Past Medical History  Diagnosis Date  . Alzheimer disease   . Hyperlipidemia   . H/O prostate cancer   . Cataract    Past Surgical History  Procedure Laterality Date  . High dose radiation implant insertion      for prostate ca  . Prostate surgery     Family History  Problem Relation Age of Onset  . Cancer Brother      ? lung  . Diabetes Brother     not definite  . Heart disease Neg Hx   . Stroke Neg Hx   . Dementia Mother    History  Substance Use Topics  . Smoking status: Never Smoker   . Smokeless tobacco: Never Used  . Alcohol Use: No    Review of Systems  Constitutional: Positive for activity change, appetite change and fatigue. Negative for fever.  HENT: Positive for congestion and rhinorrhea.   Respiratory: Positive for cough and shortness of breath.   Cardiovascular: Negative for chest pain.  Gastrointestinal: Negative for nausea, vomiting and abdominal pain.  Genitourinary: Negative for dysuria and  hematuria.  Musculoskeletal: Negative for arthralgias and back pain.  Neurological: Positive for weakness.  A complete 10 system review of systems was obtained and all systems are negative except as noted in the HPI and PMH.    Allergies  Review of patient's allergies indicates no known allergies.  Home Medications   Current Outpatient Rx  Name  Route  Sig  Dispense  Refill  . donepezil (ARICEPT) 5 MG tablet   Oral   Take 5 mg by mouth at bedtime.         . memantine (NAMENDA) 10 MG tablet   Oral   Take 10 mg by mouth daily.         . Multiple Vitamins-Minerals (MULTIVITAMIN PO)   Oral   Take by mouth daily.          BP 109/47  Pulse 38  Temp(Src) 98.7 F (37.1 C) (Oral)  Resp 21  Wt 123 lb (55.792 kg)  SpO2 94% Physical Exam  Constitutional: He is oriented to person, place, and time. He appears well-developed and well-nourished. No distress.  Moist cough  HENT:  Head: Normocephalic and atraumatic.  Mouth/Throat: Oropharynx is clear and moist. No oropharyngeal exudate.  Eyes: Conjunctivae and EOM are normal. Pupils are equal, round, and reactive to light.  Neck: Normal range of motion. Neck supple.  Cardiovascular: Normal rate, regular  rhythm and normal heart sounds.   No murmur heard. Pulmonary/Chest: Effort normal and breath sounds normal. No respiratory distress.  Basilar rhonchi  Abdominal: Soft. There is no tenderness. There is no rebound and no guarding.  Musculoskeletal: Normal range of motion. He exhibits no edema and no tenderness.  Neurological: He is alert and oriented to person, place, and time. No cranial nerve deficit. He exhibits normal muscle tone. Coordination normal.  Skin: Skin is warm.    ED Course  Procedures (including critical care time) Labs Review Labs Reviewed  GLUCOSE, CAPILLARY - Abnormal; Notable for the following:    Glucose-Capillary 167 (*)    All other components within normal limits  CBC WITH DIFFERENTIAL - Abnormal;  Notable for the following:    WBC 10.7 (*)    HCT 37.9 (*)    RDW 15.9 (*)    Platelets 82 (*)    Neutrophils Relative % 39 (*)    Monocytes Relative 45 (*)    Monocytes Absolute 4.8 (*)    All other components within normal limits  COMPREHENSIVE METABOLIC PANEL - Abnormal; Notable for the following:    Glucose, Bld 145 (*)    BUN 25 (*)    Total Protein 5.8 (*)    Albumin 3.1 (*)    AST 40 (*)    GFR calc non Af Amer 74 (*)    GFR calc Af Amer 86 (*)    All other components within normal limits  URINALYSIS, ROUTINE W REFLEX MICROSCOPIC - Abnormal; Notable for the following:    Color, Urine AMBER (*)    APPearance CLOUDY (*)    Protein, ur 30 (*)    All other components within normal limits  INFLUENZA PANEL BY PCR (TYPE A & B, H1N1) - Abnormal; Notable for the following:    Influenza A By PCR POSITIVE (*)    All other components within normal limits  URINE MICROSCOPIC-ADD ON - Abnormal; Notable for the following:    Casts HYALINE CASTS (*)    All other components within normal limits  CULTURE, BLOOD (ROUTINE X 2)  CULTURE, BLOOD (ROUTINE X 2)  TROPONIN I  PRO B NATRIURETIC PEPTIDE   Imaging Review Dg Chest 2 View  10/17/2013   CLINICAL DATA:  Cough and chest congestion.  EXAM: CHEST  2 VIEW  COMPARISON:  Chest x-ray 10/14/2013.  FINDINGS: Emphysematous changes are again noted throughout the lungs bilaterally. There is an opacity in the medial aspect of the left base, concerning for left lower lobe airspace consolidation. Linear opacities at the right base may reflect areas of subsegmental atelectasis and/or scarring. No definite pleural effusions (although the costophrenic sulci are both excluded on the lateral projection, and the left costophrenic sulcus is not visualized on the frontal projection). No evidence of pulmonary edema. Heart size is normal. The patient is rotated to the left next healed atherosclerosis in the thoracic aorta. On today's exam, resulting in distortion of  the mediastinal contours and reduced diagnostic sensitivity and specificity for mediastinal pathology.  IMPRESSION: 1. Findings are concerning for probable left lower lobe pneumonia. Repeat radiographs in 2-3 weeks following appropriate trial of antimicrobial therapy is recommended to ensure complete resolution of these findings. 2. Emphysema redemonstrated. 3. Atherosclerosis. 4. Probable subsegmental atelectasis and/or scarring in the right lung base.   Electronically Signed   By: Vinnie Langton M.D.   On: 10/17/2013 10:21    EKG Interpretation    Date/Time:  Saturday October 17 2013 09:28:51 EST Ventricular  Rate:  93 PR Interval:  129 QRS Duration: 80 QT Interval:  356 QTC Calculation: 443 R Axis:   88 Text Interpretation:  Sinus rhythm Borderline right axis deviation No significant change was found Confirmed by Wyvonnia Dusky  MD, Jaydian Santana ([id number]) on 10/17/2013 9:39:56 AM            MDM   1. CAP (community acquired pneumonia)   2. Influenza    Fatigue, generalized weakness, productive cough after pneumonia treatment. Patient is in no distress the lungs are clear. Vitals are stable.  Chest x-ray shows persistent left lower lobe infiltrate. White blood cell count is 10.  Patient is elderly and has had outpatient treatment for pneumonia but becoming weaker and more anorexic. He'll need admission to the hospital for IV antibiotics and IV fluids. His flu swab is positive. We'll not treat with tamiflu at this time as he has been sick for several weeks    Vincent Essex, MD 10/17/13 1521

## 2013-10-17 NOTE — H&P (Signed)
[facility] Admission History and Physical Service Pager: [phone]  Patient name: Vincent Lee Medical record number: [id number] Date of birth: Feb 11, 1921 Age: 80 y.o. Gender: male  Primary Care Provider: Unice Cobble, MD Consultants: None Code Status: DNR  Chief Complaint: Malaise & Productive cough  Assessment and Plan: Vincent Lee is a 80 y.o. male presenting with Influenza +/- COPD exacerbation. PMH is significant for Alzheimer, HLD, Hx of prostate CA.   # Influenza +/- COPD exacerbation -  As influenza positive on admission, and had URI symptoms that began 3 weeks ago and now worsening with productive cough, decreased appetite and malaise per wife.  Recently completed levaquin as outpatient on 1/20. CXR concerning for probable left lower lobe pneumonia - s/p ceftriaxone and azithromycin in ED; with positive flu and wife's reports of some difficulty swallowing in the last few days, will broaden coverage to vanc and zosyn - will get swallow eval to assess aspiration risk - Duonebs q4hrs; Incentive Spirometry  - Repeat CXR in AM - PT/OT to eval & Treat - Vanc and Zosyn (1/24>>) Day 1  - BCx: needs to be collected - Flu +; Tamiflu (1/24>>) Day 1 - oxygen as needed to keep O2 sats >90%  # Dementia - at baseline per wife - Continue Aricept & Namenda  # Thrombocytopenia - Plts 82   - Likely due to viral illness vs ITP; Hgb wnl - Continue to monitor while on Heparin  FEN/GI: NS @ 75; Reg Diet Prophylaxis: Heparin   Disposition: Admit to tele; Attending Dr Andria Frames; Discharge pending improved respiratory status   History of Present Illness: Vincent Lee is a 80 y.o. male presenting with cough, decreased appetite and generalized malaise. Seen at [facility] on 1/10 for 2 wks of URI symptoms and productive cough and given Rx for Levaquin. He returned to [facility] on 1/17 w/ inc fatigue and decreased appetite. Mr Dumlao has no complaints and  denies CP, SOB, Fatigue.  However, his wife reports that he has continued to not eat well and is weaker with a productive cough.  She states that he has complained of dry mouth the last few days.  Also complained of difficulty swallowing but she thinks that might be due to the dry mouth.  She denies any choking or gasping episodes with eating.  Review Of Systems: Per HPI with the following additions:  Otherwise 12 point review of systems was performed and was unremarkable.  Patient Active Problem List   Diagnosis Date Noted  . UNSPECIFIED ANEMIA 09/30/2008  . CAROTID BRUIT, RIGHT 09/30/2008  . PROSTATE CANCER, HX OF 11/04/2007  . HYPERLIPIDEMIA 10/13/2007  . UNSPECIFIED THROMBOCYTOPENIA 10/13/2007  . TIA 10/13/2007  . Memory loss 10/13/2007   Past Medical History: Past Medical History  Diagnosis Date  . Alzheimer disease   . Hyperlipidemia   . H/O prostate cancer   . Cataract    Past Surgical History: Past Surgical History  Procedure Laterality Date  . High dose radiation implant insertion      for prostate ca  . Prostate surgery     Social History: History  Substance Use Topics  . Smoking status: Never Smoker   . Smokeless tobacco: Never Used  . Alcohol Use: No   Additional social history:   Please also refer to relevant sections of EMR.  Family History: Family History  Problem Relation Age of Onset  . Cancer Brother      ? lung  . Diabetes Brother  not definite  . Heart disease Neg Hx   . Stroke Neg Hx   . Dementia Mother    Allergies and Medications: No Known Allergies No current facility-administered medications on file prior to encounter.   Current Outpatient Prescriptions on File Prior to Encounter  Medication Sig Dispense Refill  . Multiple Vitamins-Minerals (MULTIVITAMIN PO) Take by mouth daily.        Objective: BP 109/47  Pulse 38  Temp(Src) 98.7 F (37.1 C) (Oral)  Resp 21  Wt 123 lb (55.792 kg)  SpO2 94% Exam: General: Thin, frail  elderly male; NAD HEENT: MMM Cardiovascular: RRR; murmur present Respiratory: decreased breath sounds b/l; No wheeze, rhonchi; rhonchi in RLL Extremities: WWP, No LE edema  Labs and Imaging: CBC BMET   Recent Labs Lab 10/17/13 1045  WBC 10.7*  HGB 13.2  HCT 37.9*  PLT 82*    Recent Labs Lab 10/17/13 1045  NA 139  K 4.7  CL 103  CO2 22  BUN 25*  CREATININE 0.85  GLUCOSE 145*  CALCIUM 8.8     CXR 1/24 IMPRESSION:  1. Findings are concerning for probable left lower lobe pneumonia.  Repeat radiographs in 2-3 weeks following appropriate trial of  antimicrobial therapy is recommended to ensure complete resolution  of these findings.  2. Emphysema redemonstrated.  3. Atherosclerosis.  4. Probable subsegmental atelectasis and/or scarring in the right  lung base.  Phill Myron, MD 10/17/2013, 2:00 PM PGY-1, [place] [facility] pager: [phone], text pages welcome  PGY-3 Addendum I have seen and examined this patient.  I agree with the above note with my edits in pink.  Skie Vitrano 10/17/2013, 5:26 PM

## 2013-10-17 NOTE — ED Notes (Signed)
To ED w wife-- states pt is weaker/not eating/ "Listless" . Was seen at urgent care on Thursday for pneumonia. Wife states pt has dementia and she will answer questions. Pt has moist productive cough,

## 2013-10-17 NOTE — ED Notes (Signed)
Pt was able to walk well and spo2 remained between 92-90% during the walk.  Pt did not complain of shortness of breath.

## 2013-10-17 NOTE — ED Notes (Signed)
Patient transported to X-ray 

## 2013-10-17 NOTE — Progress Notes (Signed)
ANTIBIOTIC CONSULT NOTE - INITIAL  Pharmacy Consult for Vancomycin, Zosyn Indication: pneumonia  No Known Allergies  Patient Measurements: Height: 5\' 10"  (177.8 cm) Weight: 117 lb 9.6 oz (53.343 kg) IBW/kg (Calculated) : 73  Labs:  Recent Labs  10/17/13 1045  WBC 10.7*  HGB 13.2  PLT 82*  CREATININE 0.85   Microbiology: No results found for this or any previous visit (from the past 720 hour(s)).  Medical History: Past Medical History  Diagnosis Date  . Alzheimer disease   . Hyperlipidemia   . H/O prostate cancer   . Cataract   . Cancer of skin     HAD FOR YEARS, REMOVED FOR YEARS    Assessment: 80 year old male beginning Vancomycin and Zosyn for ? pneumonia  Goal of Therapy:  Vancomycin trough level 15-20 mcg/ml Appropriate Zosyn dosing  Plan:  1) Zosyn 3.375 grams iv Q 8 hours 2) Vancomycin 500 mg iv Q 12 hours 3) Follow up cultures, Scr, plan  Thank you. Anette Guarneri, PharmD [phone]  10/17/2013,5:14 PM

## 2013-10-18 ENCOUNTER — Inpatient Hospital Stay (HOSPITAL_COMMUNITY): Payer: Medicare Other

## 2013-10-18 DIAGNOSIS — R5381 Other malaise: Secondary | ICD-10-CM

## 2013-10-18 DIAGNOSIS — R5383 Other fatigue: Secondary | ICD-10-CM

## 2013-10-18 DIAGNOSIS — J111 Influenza due to unidentified influenza virus with other respiratory manifestations: Secondary | ICD-10-CM

## 2013-10-18 DIAGNOSIS — R413 Other amnesia: Secondary | ICD-10-CM

## 2013-10-18 DIAGNOSIS — J189 Pneumonia, unspecified organism: Secondary | ICD-10-CM

## 2013-10-18 LAB — CBC
HCT: 37.6 % — ABNORMAL LOW (ref 39.0–52.0)
Hemoglobin: 12.9 g/dL — ABNORMAL LOW (ref 13.0–17.0)
MCH: 29.5 pg (ref 26.0–34.0)
MCHC: 34.3 g/dL (ref 30.0–36.0)
MCV: 86 fL (ref 78.0–100.0)
PLATELETS: 62 10*3/uL — AB (ref 150–400)
RBC: 4.37 MIL/uL (ref 4.22–5.81)
RDW: 16 % — ABNORMAL HIGH (ref 11.5–15.5)
WBC: 7.7 10*3/uL (ref 4.0–10.5)

## 2013-10-18 LAB — BASIC METABOLIC PANEL
BUN: 17 mg/dL (ref 6–23)
CALCIUM: 8.2 mg/dL — AB (ref 8.4–10.5)
CO2: 24 meq/L (ref 19–32)
CREATININE: 0.75 mg/dL (ref 0.50–1.35)
Chloride: 104 mEq/L (ref 96–112)
GFR calc Af Amer: >90 mL/min (ref >90)
GFR calc non Af Amer: 78 mL/min — ABNORMAL LOW (ref >90)
Glucose, Bld: 82 mg/dL (ref 70–99)
Potassium: 4.4 mEq/L (ref 3.7–5.3)
Sodium: 140 mEq/L (ref 137–147)

## 2013-10-18 LAB — HIV ANTIBODY (ROUTINE TESTING W REFLEX): HIV: NONREACTIVE

## 2013-10-18 LAB — GLUCOSE, CAPILLARY: Glucose-Capillary: 106 mg/dL — ABNORMAL HIGH (ref 70–99)

## 2013-10-18 MED ORDER — ALBUTEROL SULFATE (2.5 MG/3ML) 0.083% IN NEBU: 2.5000 mg | INHALATION_SOLUTION | Freq: Four times a day (QID) | RESPIRATORY_TRACT | Status: DC | PRN

## 2013-10-18 MED ORDER — IPRATROPIUM-ALBUTEROL 0.5-2.5 (3) MG/3ML IN SOLN
3.0000 mL | Freq: Three times a day (TID) | RESPIRATORY_TRACT | Status: DC
  Filled 2013-10-18 (×3): qty 3

## 2013-10-18 MED ORDER — DEXTROSE-NACL 5-0.9 % IV SOLN
INTRAVENOUS | Status: DC
  Administered 2013-10-18: 75 mL/h via INTRAVENOUS
  Administered 2013-10-18 – 2013-10-20 (×3): via INTRAVENOUS

## 2013-10-18 NOTE — Progress Notes (Signed)
Seen and examined.  Agree with Dr. Bridgett Larsson.  See my co-sign of the H&PE for my note of today.

## 2013-10-18 NOTE — H&P (Signed)
Seen and examined.  Discussed with Dr. Bridgett Larsson.  Agree with her management and documentation.  Briefly, 80 yo male with sputtering respiratory illness.  Apparently first ill on 1/10  Seen and treated for pneumonia with levofloxicin.  Improved some and then worsened again.  Especially weak and per wife not looking right for the last 2-3 days.    He has a positive influenza test and I am concerned with pneumonia on CXR.  I do not have a good feel for how much is acute (last 2-3 days) and how much is lingering from the start of his illness.  We are treating as if all is acute.   One thing is very clear.  This 80 yo is weaker than his baseline and will need PT eval prior to DC.

## 2013-10-18 NOTE — Evaluation (Signed)
Physical Therapy Evaluation Patient Details Name: Vincent Lee MRN: [id number] DOB: 1921-02-11 Today's Date: 10/18/2013 Time: 7619-5093 PT Time Calculation (min): 27 min  PT Assessment / Plan / Recommendation History of Present Illness  Pt admit with CAP.  Clinical Impression  Pt admitted with above. Pt currently with functional limitations due to the deficits listed below (see PT Problem List). Recommend HHPT and 24 hour care.  Pt has a RW to use initially.   Pt will benefit from skilled PT to increase their independence and safety with mobility to allow discharge to the venue listed below.     PT Assessment  Patient needs continued PT services    Follow Up Recommendations  Home health PT;Supervision/Assistance - 24 hour                Equipment Recommendations  None recommended by PT         Frequency Min 3X/week    Precautions / Restrictions Precautions Precautions: Fall Restrictions Weight Bearing Restrictions: No   Pertinent Vitals/Pain VSS, no pain      Mobility  Bed Mobility Overal bed mobility: Needs Assistance Bed Mobility: Supine to Sit Supine to sit: Min guard General bed mobility comments: incr time needed to come to EOB.   Transfers Overall transfer level: Needs assistance Equipment used: 1 person hand held assist Transfers: Sit to/from Stand Sit to Stand: Min assist General transfer comment: Pt needed steadying assist upon standing.   Ambulation/Gait Ambulation/Gait assistance: Min guard;Min assist Ambulation Distance (Feet): 150 Feet Assistive device: 1 person hand held assist;None Gait Pattern/deviations: Decreased stride length;Trunk flexed;Wide base of support;Leaning posteriorly Gait velocity interpretation: Below normal speed for age/gender General Gait Details: Pt ambulated with one person HHA and without.  Needs UE support for ultimate safety.  Unsteady if challenges to balance given.  Will need RW and 24 hour supervision initially.            PT Diagnosis: Generalized weakness  PT Problem List: Decreased activity tolerance;Decreased balance;Decreased mobility;Decreased knowledge of use of DME;Decreased safety awareness;Decreased knowledge of precautions PT Treatment Interventions: DME instruction;Gait training;Therapeutic activities;Functional mobility training;Therapeutic exercise;Balance training;Patient/family education     PT Goals(Current goals can be found in the care plan section) Acute Rehab PT Goals Patient Stated Goal: to go home PT Goal Formulation: With patient Time For Goal Achievement: 10/25/13 Potential to Achieve Goals: Good  Visit Information  Last PT Received On: 10/18/13 Assistance Needed: +1 History of Present Illness: Pt admit with CAP.       Prior [place] expects to be discharged to:: Private residence Living Arrangements: Spouse/significant other Available Help at Discharge: Family Type of Home: [facility] [place] Home Access: Level entry Home Layout: [place] - 2 wheels;Grab bars - tub/shower;Hand held shower head;Grab bars - toilet;Shower seat - built in (handicapped height toilet) Prior Function Level of Independence: Independent Comments: Wife reports pt was requiring more assist when he acquired PNA over the last few days. Communication Communication: No difficulties    Cognition  Cognition Arousal/Alertness: Awake/alert Behavior During Therapy: WFL for tasks assessed/performed Overall Cognitive Status: Within Functional Limits for tasks assessed    Extremity/Trunk Assessment Upper Extremity Assessment Upper Extremity Assessment: Defer to OT evaluation Lower Extremity Assessment Lower Extremity Assessment: Generalized weakness Cervical / Trunk Assessment Cervical / Trunk Assessment: Kyphotic   Balance Balance Overall balance assessment: Needs assistance;History of Falls Postural  control: Posterior lean Standing balance support: No upper extremity supported;During functional  activity Standing balance-[name] Scale: Fair Standing balance comment: Pt with slight posterior lean.  Pt able to balance in static stande without challenges but struggles with challenges.   High level balance activites: Direction changes;Turns;Sudden stops High Level Balance Comments: Needs min guard assist with these activities.   End of Session PT - End of Session Equipment Utilized During Treatment: Gait belt Activity Tolerance: Patient limited by fatigue Patient left: in chair;with call bell/phone within reach;with family/visitor present;with chair alarm set Nurse Communication: Mobility status       INGOLD,Cloma Rahrig 10/18/2013, 11:13 AM [facility] [phone] (pager)

## 2013-10-18 NOTE — Progress Notes (Signed)
Family Medicine Teaching Service Daily Progress Note Intern Pager: [phone]  Patient name: Vincent Lee Medical record number: [id number] Date of birth: 02-27-1921 Age: 80 y.o. Gender: male  Primary Care Provider: Unice Cobble, MD Consultants: none   Code Status: DNR  Pt Overview and Major Events to Date:  1/24: Admitted for fatigue and flu with likely PNA  Assessment and Plan: Vincent Lee is a 80 y.o. male presenting with Influenza +/- COPD exacerbation. PMH is significant for Alzheimer, HLD, Hx of prostate CA.   # Influenza +/- COPD exacerbation  - As influenza positive on admission, and had URI symptoms that began 3 weeks ago and now worsening with productive cough, decreased appetite and malaise per wife. Recently completed levaquin as outpatient on 1/20. CXR concerning for probable left lower lobe pneumonia.  Repeat CXR with bibasilar disease. - continue tamiflu, vanc, and zosyn (1/24>>) - will get swallow eval to assess aspiration risk  - Duonebs q4hrs; Incentive Spirometry  - PT/OT to eval & Treat  - BCx: pending  - oxygen as needed to keep O2 sats >90%   # Dementia  - at baseline per wife  - Continue Aricept & Namenda   # Thrombocytopenia  - Plts 82 > 62 - Likely due to viral illness vs ITP; Hgb wnl  - Continue to monitor while on Heparin   FEN/GI: D5NS @ 75; Reg Diet  Prophylaxis: Heparin    Disposition: pending clinical improvement  Subjective: He reports not feeling so well this morning.  Wife reports he is not eating much and is still fatigued.  He did get up to the chair this morning with PT.  Continues to cough.  Objective: Temp:  [98.1 F (36.7 C)-98.7 F (37.1 C)] 98.3 F (36.8 C) (01/25 0503) Pulse Rate:  [38-101] 101 (01/25 0503) Resp:  [16-33] 24 (01/25 0503) BP: (90-130)/(40-96) 113/48 mmHg (01/25 0503) SpO2:  [85 %-96 %] 95 % (01/25 0503) FiO2 (%):  [21 %] 21 % (01/24 1412) Weight:  [117 lb 9.6 oz (53.343 kg)-123 lb (55.792 kg)]  117 lb 9.6 oz (53.343 kg) (01/24 1700) Physical Exam: General: alert, cooperative, NAD Cardiovascular: distant heart sounds, RRR, no murmurs Respiratory: normal work of breathing; good air movement; scattered rhonchi in right lung Abdomen: +BS Extremities: no edema  Laboratory:  Recent Labs Lab 10/14/13 1133 10/14/13 1138 10/17/13 1045 10/18/13 0506  WBC 9.9  --  10.7* 7.7  HGB 13.6 14.3 13.2 12.9*  HCT 40.5 42.0 37.9* 37.6*  PLT 134*  --  82* 62*    Recent Labs Lab 10/14/13 1138 10/17/13 1045 10/18/13 0506  NA 141 139 140  K 4.3 4.7 4.4  CL 103 103 104  CO2  --  22 24  BUN 24* 25* 17  CREATININE 1.10 0.85 0.75  CALCIUM  --  8.8 8.2*  PROT  --  5.8*  --   BILITOT  --  0.9  --   ALKPHOS  --  78  --   ALT  --  12  --   AST  --  40*  --   GLUCOSE 190* 145* 82    Influenza A +  Trop neg Pro-BNP 304  Blood cultures: pending  Imaging/Diagnostic Tests: CXR:  Bibasilar pulmonary airspace disease, without significant change.  COPD.   Melony Overly, MD 10/18/2013, 9:01 AM PGY-3, [place] Intern pager: [phone], text pages welcome

## 2013-10-19 LAB — CBC
HCT: 39.3 % (ref 39.0–52.0)
Hemoglobin: 13.2 g/dL (ref 13.0–17.0)
MCH: 29.9 pg (ref 26.0–34.0)
MCHC: 33.6 g/dL (ref 30.0–36.0)
MCV: 88.9 fL (ref 78.0–100.0)
Platelets: DECREASED 10*3/uL (ref 150–400)
RBC: 4.42 MIL/uL (ref 4.22–5.81)
RDW: 16.4 % — ABNORMAL HIGH (ref 11.5–15.5)
WBC: 6.4 10*3/uL (ref 4.0–10.5)

## 2013-10-19 LAB — BASIC METABOLIC PANEL
BUN: 13 mg/dL (ref 6–23)
CHLORIDE: 107 meq/L (ref 96–112)
CO2: 18 mEq/L — ABNORMAL LOW (ref 19–32)
Calcium: 8 mg/dL — ABNORMAL LOW (ref 8.4–10.5)
Creatinine, Ser: 0.75 mg/dL (ref 0.50–1.35)
GFR calc Af Amer: >90 mL/min (ref >90)
GFR, EST NON AFRICAN AMERICAN: 78 mL/min — AB (ref >90)
Glucose, Bld: 130 mg/dL — ABNORMAL HIGH (ref 70–99)
Potassium: 5 mEq/L (ref 3.7–5.3)
Sodium: 138 mEq/L (ref 137–147)

## 2013-10-19 LAB — GLUCOSE, CAPILLARY: Glucose-Capillary: 109 mg/dL — ABNORMAL HIGH (ref 70–99)

## 2013-10-19 MED ORDER — OSELTAMIVIR PHOSPHATE 75 MG PO CAPS: 75.0000 mg | ORAL_CAPSULE | Freq: Two times a day (BID) | ORAL | Status: DC

## 2013-10-19 MED ORDER — OSELTAMIVIR PHOSPHATE 30 MG PO CAPS
30.0000 mg | ORAL_CAPSULE | Freq: Two times a day (BID) | ORAL | Status: DC
  Administered 2013-10-19 – 2013-10-22 (×7): 30 mg via ORAL
  Filled 2013-10-19 (×8): qty 1

## 2013-10-19 MED ORDER — GUAIFENESIN ER 600 MG PO TB12
600.0000 mg | ORAL_TABLET | Freq: Two times a day (BID) | ORAL | Status: DC
  Administered 2013-10-19 – 2013-10-22 (×7): 600 mg via ORAL
  Filled 2013-10-19 (×8): qty 1

## 2013-10-19 NOTE — Progress Notes (Signed)
Family Medicine Teaching Service Daily Progress Note Intern Pager: [phone]  Patient name: Vincent Lee Medical record number: [id number] Date of birth: 1921-04-08 Age: 80 y.o. Gender: male  Primary Care Provider: Unice Cobble, MD Consultants: none   Code Status: DNR  Pt Overview and Major Events to Date:  1/24: Admitted for fatigue and flu with likely LLL PNA seen on CXR, 1 dose Rocephin/Azithro 1/25: rpt CXR- bibasilar pulmonary airspace disease, Switch to Vanc/Zosyn, Add Tamiflu 1/26:  Blood Cx- NGTD, Influenza pos, Tamiflu cont, PT eval, Speech eval for poss chronic aspiration, cont abx   Assessment and Plan: Vincent Lee is a 80 y.o. male presenting with Influenza +/- COPD exacerbation. PMH is significant for Alzheimer, HLD, Hx of prostate CA.   # Influenza +/- COPD exacerbation  - As influenza positive on admission, and had URI symptoms that began 3 weeks ago and now worsening with productive cough, decreased appetite and malaise per wife. Recently completed levaquin as outpatient on 1/20. CXR concerning for probable left lower lobe pneumonia.  Repeat CXR with bibasilar disease. - continue tamiflu for 5 days, vanc, and zosyn (1/24>>) - will get swallow eval to assess aspiration risk  - Duonebs TID; Incentive Spirometry  - PT/OT to eval & Treat  - BCx: NGTD - oxygen as needed to keep O2 sats >90%   # Dementia  - at baseline per wife  - Continue Aricept & Namenda   # Thrombocytopenia  - Plts 82 > 62 yesterday, repeat today shows clumps that are not quantified - Likely due to viral illness vs ITP; Hgb wnl  - Continue to monitor while on Heparin   FEN/GI: D5NS @ 75; Reg Diet  Prophylaxis: Heparin    Disposition: pending clinical improvement  Subjective: Patient says he slept great overnight and feels about the same today.  Wife continues to be concerned that he is not eating much. Remains afebrile.  Objective: Temp:  [97.5 F (36.4 C)-98.7 F (37.1 C)]  98 F (36.7 C) (01/26 0700) Pulse Rate:  [74-93] 76 (01/26 0700) Resp:  [18] 18 (01/26 0700) BP: (96-126)/(40-70) 98/70 mmHg (01/26 0700) SpO2:  [94 %-98 %] 98 % (01/26 0700) Physical Exam: General: alert, cooperative, NAD Cardiovascular: distant heart sounds, RRR, no murmurs Respiratory: normal work of breathing; scattered rhonchi throughout but no wheezing or crackles appreciated. Productive cough on deep inspiration. Abdomen: +BS Extremities: no edema  Laboratory:  Recent Labs Lab 10/17/13 1045 10/18/13 0506 10/19/13 1130  WBC 10.7* 7.7 6.4  HGB 13.2 12.9* 13.2  HCT 37.9* 37.6* 39.3  PLT 82* 62* PLATELET CLUMPS NOTED ON SMEAR, COUNT APPEARS DECREASED    Recent Labs Lab 10/14/13 1138 10/17/13 1045 10/18/13 0506  NA 141 139 140  K 4.3 4.7 4.4  CL 103 103 104  CO2  --  22 24  BUN 24* 25* 17  CREATININE 1.10 0.85 0.75  CALCIUM  --  8.8 8.2*  PROT  --  5.8*  --   BILITOT  --  0.9  --   ALKPHOS  --  78  --   ALT  --  12  --   AST  --  40*  --   GLUCOSE 190* 145* 82    Influenza A +  Trop neg Pro-BNP 304  Blood cultures: NGTD  Imaging/Diagnostic Tests: CXR: 10/18/13 Bibasilar pulmonary airspace disease, without significant change.  COPD.  CXR: 10/17/13 1. Findings are concerning for probable left lower lobe pneumonia.  Repeat radiographs in 2-3 weeks following  appropriate trial of  antimicrobial therapy is recommended to ensure complete resolution  of these findings.   Thank you, Charline Bills, Med Student 10/19/2013, 1:37 PM    R3 Addendum:  I have seen the above patient and have discussed the patient's presentation, history, objective data, physical exam and assessment and plan with [name].    Patient reports no specific complaints and is reportedly feeling somewhat better overall.    Vital signs and labs reviewed.  Notable for influenza positive. PHYSICAL EXAM: GENERAL:  elderly thin male. In no discomfort; no respiratory distress   PSYCH: alert and appropriate, good insight   HNEENT:  no JVD   CARDIO: RRR, S1/S2 heard, no murmur  LUNGS:  diffusely coarse breath sounds throughout worst in bibasilar with some transmitted upper respiratory sounds; poor inspiratory effort, poor clearing of the airway   ABDOMEN:  no belly breathing   EXTREM:  Warm, well perfused.  Moves all 4 extremities spontaneously; no lateralization.   Distal pulses 1+/4.  no pretibial edema.  GU:   SKIN:     A/P: Influenza & R Lower Lobe Community Acquired PNA vs Aspiration PNA: Started on Tamiflu.  On vancomycin and Zosyn for potential post influenza MRSA pneumonia (less likely) and potential aspiration pneumonia with Zosyn.  ST to evaluate for potential MBSS to assess for silent aspiration.  Add mucinex to help mobilize secretions.    Dementia: At baseline, complicating ongoing care  Thrombocytopenia: Recheck CBC  Disposition: Will return to assisted living facility with home health PT/OT when improve from a respiratory standpoint and further evaluation for silent aspiration has been completed   Gerda Diss, [place] Medicine Resident - PGY-3 10/19/2013 3:35 PM

## 2013-10-19 NOTE — Progress Notes (Signed)
Physical Therapy Treatment Patient Details Name: Vincent Lee MRN: [id number] DOB: [birth year] Today's Date: 10/19/2013 Time: 4709-6283 PT Time Calculation (min): 27 min  PT Assessment / Plan / Recommendation  History of Present Illness Pt admit with CAP.   PT Comments   Patient progressing with activity tolerance and little more steady walking with RW.  Wife feels comfortable assisting him at home.  Will continue acute level PT until D/C.  Follow Up Recommendations  Home health PT;Supervision/Assistance - 24 hour     Does the patient have the potential to tolerate intense rehabilitation   N/A  Barriers to Discharge  None      Equipment Recommendations  None recommended by PT    Recommendations for Other Services  None  Frequency Min 3X/week   Progress towards PT Goals Progress towards PT goals: Progressing toward goals  Plan Current plan remains appropriate    Precautions / Restrictions Precautions Precautions: Fall Restrictions Weight Bearing Restrictions: No   Pertinent Vitals/Pain No pain complaints    Mobility  Bed Mobility Supine to sit: Supervision;HOB elevated General bed mobility comments: with rail Transfers Overall transfer level: Needs assistance Equipment used: Rolling walker (2 wheeled) Transfers: Sit to/from [facility] Sit to Stand: Min guard Stand pivot transfers: Min guard General transfer comment: from [facility] to bed pivot after toileting Ambulation/Gait Ambulation/Gait assistance: Min guard Ambulation Distance (Feet): 225 Feet Assistive device: Rolling walker (2 wheeled) Gait Pattern/deviations: Step-through pattern General Gait Details: in room ambulated without walker close supervision to minguard assist for safety, assist walking with walker for turns maneuvers around obstacles with supervision    Exercises General Exercises - Lower Extremity Straight Leg Raises: AROM;Both;10 reps;Supine Low Level/ICU  Exercises Stabilized Bridging: AROM;Both;5 reps;Supine    PT Goals (current goals can now be found in the care plan section) Acute Rehab PT Goals Patient Stated Goal: to go home  Visit Information  Last PT Received On: 10/19/13 Assistance Needed: +1 History of Present Illness: Pt admit with CAP.    Subjective Data  Patient Stated Goal: to go home   Cognition  Cognition Arousal/Alertness: Awake/alert Behavior During Therapy: WFL for tasks assessed/performed Overall Cognitive Status: Within Functional Limits for tasks assessed    Balance  Balance Overall balance assessment: Needs assistance Sitting-balance support: Bilateral upper extremity supported;Feet supported Sitting balance-[name] Scale: Normal Postural control: Posterior lean Standing balance support: Bilateral upper extremity supported;No upper extremity supported Standing balance-[name] Scale: Fair Standing balance comment: washed hands at sink and manuvers in room short distances without UE support  End of Session PT - End of Session Equipment Utilized During Treatment: Gait belt Activity Tolerance: Patient tolerated treatment well Patient left: in bed;with call bell/phone within reach;with family/visitor present   GP     [facility] 10/19/2013, [place] PM Magda Kiel, [place] 10/19/2013

## 2013-10-19 NOTE — Progress Notes (Signed)
FMTS Attending Note Patient seen and examined by me this afternoon, discussed with resident team and I agree with Dr Nicolasa Ducking assessment and plan for today.  Patient reports improvement in breathing.  In good spirits.  Lung exam is clear bilaterally on my exam.  Agree with continued treatment for influenza-positive respiratory illness with oseltamivir; treatment for LLL PNA in setting of influenza with agents that cover post-influenza MRSA. Dalbert Mayotte, MD

## 2013-10-19 NOTE — Evaluation (Signed)
Occupational Therapy Evaluation Patient Details Name: MONTAY VANVOORHIS MRN: [id number] DOB: 1920-10-26 Today's Date: 10/19/2013 Time: 3295-1884 OT Time Calculation (min): 21 min  OT Assessment / Plan / Recommendation History of present illness Pt admit with CAP.   Clinical Impression   Pt demos decline in function and safety with ADLs and ADL mobility safety and would benefit from acute OT services to address impairments to increase level of function and safety. Pt and his wife reside at [place]    OT Assessment  Patient needs continued OT Services    Follow Up Recommendations  Home health OT;Supervision/Assistance - 24 hour    Barriers to Discharge   none  Equipment Recommendations  None recommended by OT    Recommendations for Other Services    Frequency  Min 2X/week    Precautions / Restrictions Precautions Precautions: Fall Restrictions Weight Bearing Restrictions: No   Pertinent Vitals/Pain No c/o pain    ADL  Grooming: Wash/dry hands;Wash/dry face;Supervision/safety;Set up;Min guard Where Assessed - Grooming: Supported standing Upper Body Bathing: Simulated;Supervision/safety;Set up Where Assessed - Upper Body Bathing: Unsupported sitting Lower Body Bathing: Simulated;Moderate assistance Upper Body Dressing: Performed;Supervision/safety;Set up Where Assessed - Upper Body Dressing: Unsupported sitting Lower Body Dressing: Performed;Maximal assistance Where Assessed - Lower Body Dressing: Unsupported sitting;Supported standing;Supported sit to stand Toilet Transfer: Moderate assistance;Performed Toilet Transfer Method: Sit to Loss adjuster, chartered: Therapist, occupational and Hygiene: Performed;Maximal assistance Where Assessed - Best boy and Hygiene: Standing Tub/Shower Transfer Method: Not assessed Equipment Used: Gait belt;Other (comment) (BSC) Transfers/Ambulation Related to ADLs: Pt  required assist for balance, HHA    OT Diagnosis:    OT Problem List: Decreased strength;Decreased knowledge of use of DME or AE;Decreased activity tolerance;Impaired balance (sitting and/or standing) OT Treatment Interventions: Self-care/ADL training;Therapeutic exercise;Patient/family education;Neuromuscular education;Balance training;Therapeutic activities;DME and/or AE instruction   OT Goals(Current goals can be found in the care plan section) Acute Rehab OT Goals Patient Stated Goal: to go home OT Goal Formulation: With patient/family Time For Goal Achievement: 10/26/13 Potential to Achieve Goals: Good ADL Goals Pt Will Perform Grooming: with supervision;with set-up;standing Pt Will Perform Upper Body Bathing: with set-up;sitting;standing Pt Will Perform Lower Body Bathing: with min assist;sit to/from stand Pt Will Perform Upper Body Dressing: with set-up;sitting;standing Pt Will Perform Lower Body Dressing: with mod assist;with min assist;sit to/from stand Pt Will Transfer to Toilet: with min guard assist;with supervision;bedside commode;ambulating;regular height toilet;grab bars Pt Will Perform Toileting - Clothing Manipulation and hygiene: sit to/from stand;with mod assist Additional ADL Goal #1: Pt will complete B UE with level 2 theraband while seated to increase strength/ independencefor ADLs and ADL mobility  Visit Information  Last OT Received On: 10/19/13 Assistance Needed: +1 History of Present Illness: Pt admit with CAP.       Prior [place] expects to be discharged to:: Private residence Living Arrangements: Spouse/significant other Available Help at Discharge: Family Type of Home: Independent living facility Home Access: Level entry [place]: One level [place]: [place] - 2 wheels;Grab bars - tub/shower;Hand held shower head;Grab bars - toilet;Shower seat - built in Prior Function Level of Independence:  Independent Comments: Wife reports pt was requiring more assist when he acquired PNA over the last few days. Communication Communication: No difficulties Dominant Hand: Right         Vision/Perception Vision - History Baseline Vision: Wears glasses all the time Patient Visual Report: No change from baseline Perception Perception: Within  Functional Limits   Cognition  Cognition Arousal/Alertness: Awake/alert Behavior During Therapy: WFL for tasks assessed/performed Overall Cognitive Status: Within Functional Limits for tasks assessed    Extremity/Trunk Assessment Upper Extremity Assessment Upper Extremity Assessment: Overall WFL for tasks assessed;Generalized weakness Lower Extremity Assessment Lower Extremity Assessment: Defer to PT evaluation Cervical / Trunk Assessment Cervical / Trunk Assessment: Kyphotic     Mobility Bed Mobility General bed mobility comments: not assessed, pt up in recliner Transfers Overall transfer level: Needs assistance Equipment used: 1 person hand held assist Transfers: Sit to/from Stand Sit to Stand: Min assist General transfer comment: Pt required assist for balance, HHA          Balance Balance Overall balance assessment: Needs assistance;History of Falls Sitting-balance support: Bilateral upper extremity supported;Feet supported Sitting balance-[name] Scale: Normal Postural control: Posterior lean Standing balance support: Single extremity supported;During functional activity Standing balance-[name] Scale: Fair   End of Session OT - End of Session Equipment Utilized During Treatment: Gait belt;Other (comment) (BSC) Activity Tolerance: Patient tolerated treatment well Patient left: in chair [facility], nursing aware) Nurse Communication: Other (comment) (pt on BSC)  GO     Britt Bottom 10/19/2013, 12:26 PM

## 2013-10-19 NOTE — Care Management Note (Signed)
Noted orders for [facility], pt is resident of [facility], will contact that facility tomorrow to clarify level of care pt is currently receiving if independent living will arrange [facility] services per [facility] recommendations for providers.  Jasmine Pang RN MPH, case manager, [phone]

## 2013-10-19 NOTE — Evaluation (Signed)
Clinical/Bedside Swallow Evaluation Patient Details  Name: MCCLAIN SHALL MRN: [id number] Date of Birth: [birth year]  Today's Date: 10/19/2013 Time: 1520-1540 SLP Time Calculation (min): 20 min  Past Medical History:  Past Medical History  Diagnosis Date  . Alzheimer disease   . Hyperlipidemia   . H/O prostate cancer   . Cataract   . Cancer of skin     HAD FOR YEARS, REMOVED FOR YEARS   Past Surgical History:  Past Surgical History  Procedure Laterality Date  . High dose radiation implant insertion      for prostate ca  . Prostate surgery     HPI:  RIPKEN REKOWSKI is a 80 y.o. male presenting with Influenza +/- COPD exacerbation. PMH is significant for Alzheimer, HLD, Hx of prostate CA.    Assessment / Plan / Recommendation Clinical Impression  Pt presents with adequate swallow function. Strong timely swallow observed. No evidence of aspiration or any struggle with PO.  Pt and wife deny any trouble eating or drinking.  Pt is a very cautious eater, taking very small bites and sips. No further testing or SLP f/u is recommended; continue current diet.     Aspiration Risk  Mild    Diet Recommendation Regular;Thin liquid   Liquid Administration via: Cup;Straw Medication Administration: Whole meds with liquid Supervision: Patient able to self feed Compensations: Slow rate;Small sips/bites Postural Changes and/or Swallow Maneuvers: Seated upright 90 degrees    Other  Recommendations     Follow Up Recommendations  None    Frequency and Duration        Pertinent Vitals/Pain NA    SLP Swallow Goals     Swallow Study Prior Functional Status       General HPI: TYRIEK HOFMAN is a 80 y.o. male presenting with Influenza +/- COPD exacerbation. PMH is significant for Alzheimer, HLD, Hx of prostate CA.  Type of Study: Bedside swallow evaluation Diet Prior to this Study: Regular;Thin liquids Temperature Spikes Noted: No Respiratory Status: Room air History  of Recent Intubation: No Behavior/Cognition: Alert;Cooperative;Pleasant mood Oral Cavity - Dentition: Adequate natural dentition Self-Feeding Abilities: Able to feed self Patient Positioning: Upright in bed Baseline Vocal Quality: Clear Volitional Cough: Strong Volitional Swallow: Able to elicit    Oral/Motor/Sensory Function Overall Oral Motor/Sensory Function: Appears within functional limits for tasks assessed   Ice Chips     Thin Liquid Thin Liquid: Within functional limits Presentation: Cup;Straw;Self Fed    Nectar Thick Nectar Thick Liquid: Not tested   Honey Thick Honey Thick Liquid: Not tested   Puree Puree: Within functional limits   Solid   GO    Solid: Within functional limits      Herbie Baltimore, MA CCC-SLP [phone]  Rhia Blatchford, Katherene Ponto 10/19/2013,3:51 PM

## 2013-10-20 ENCOUNTER — Encounter: Payer: Self-pay | Admitting: *Deleted

## 2013-10-20 LAB — CBC
HEMATOCRIT: 37.5 % — AB (ref 39.0–52.0)
Hemoglobin: 12.9 g/dL — ABNORMAL LOW (ref 13.0–17.0)
MCH: 29.8 pg (ref 26.0–34.0)
MCHC: 34.4 g/dL (ref 30.0–36.0)
MCV: 86.6 fL (ref 78.0–100.0)
Platelets: 51 10*3/uL — ABNORMAL LOW (ref 150–400)
RBC: 4.33 MIL/uL (ref 4.22–5.81)
RDW: 15.9 % — AB (ref 11.5–15.5)
WBC: 5.3 10*3/uL (ref 4.0–10.5)

## 2013-10-20 LAB — DIC (DISSEMINATED INTRAVASCULAR COAGULATION) PANEL
APTT: 33 s (ref 24–37)
PROTHROMBIN TIME: 15.3 s — AB (ref 11.6–15.2)

## 2013-10-20 LAB — DIC (DISSEMINATED INTRAVASCULAR COAGULATION)PANEL
D-Dimer, Quant: 6.46 ug/mL-FEU — ABNORMAL HIGH (ref 0.00–0.48)
Fibrinogen: 402 mg/dL (ref 204–475)
INR: 1.24 (ref 0.00–1.49)
Platelets: 53 10*3/uL — ABNORMAL LOW (ref 150–400)

## 2013-10-20 LAB — VANCOMYCIN, TROUGH: Vancomycin Tr: 9.3 ug/mL — ABNORMAL LOW (ref 10.0–20.0)

## 2013-10-20 LAB — GLUCOSE, CAPILLARY: Glucose-Capillary: 105 mg/dL — ABNORMAL HIGH (ref 70–99)

## 2013-10-20 MED ORDER — VANCOMYCIN HCL 500 MG IV SOLR
500.0000 mg | Freq: Three times a day (TID) | INTRAVENOUS | Status: DC
  Administered 2013-10-20 – 2013-10-21 (×2): 500 mg via INTRAVENOUS
  Filled 2013-10-20 (×4): qty 500

## 2013-10-20 MED ORDER — HYDROCORTISONE SOD SUCCINATE 100 MG PF FOR IT USE: 50.0000 mg | Freq: Four times a day (QID) | INTRAMUSCULAR | Status: DC

## 2013-10-20 MED ORDER — HYDROCORTISONE SOD SUCCINATE 100 MG IJ SOLR
50.0000 mg | Freq: Four times a day (QID) | INTRAMUSCULAR | Status: DC
  Administered 2013-10-20 – 2013-10-21 (×3): 50 mg via INTRAVENOUS
  Filled 2013-10-20 (×8): qty 1

## 2013-10-20 NOTE — Progress Notes (Signed)
Occupational Therapy Treatment Patient Details Name: Vincent Lee MRN: [id number] DOB: 1921-03-14 Today's Date: 10/20/2013 Time: 1142-1209 OT Time Calculation (min): 27 min  OT Assessment / Plan / Recommendation  History of present illness Pt admit with CAP.   OT comments  Agreeable to oob.  Requires inst. And tactile cues for completion of tasks due to visible confusion.  Pt.s wife present and reports hx. Of dementia and states he has been more confused during this hospital stay.  Min a for pivot transfers mostly due to visible sequencing issues.  Follow Up Recommendations  Home health OT;Supervision/Assistance - 24 hour           Equipment Recommendations  None recommended by OT        Frequency Min 2X/week   Progress towards OT Goals Progress towards OT goals: Progressing toward goals  Plan Discharge plan remains appropriate    Precautions / Restrictions Precautions Precautions: Fall Restrictions Weight Bearing Restrictions: No   Pertinent Vitals/Pain No c/o per pt    ADL  Toilet Transfer: Performed;Minimal assistance Toilet Transfer Method: Sit to stand;Stand pivot Toilet Transfer Equipment: Bedside commode Toileting - Clothing Manipulation and Hygiene: Performed;Moderate assistance Where Assessed - Toileting Clothing Manipulation and Hygiene: Standing Transfers/Ambulation Related to ADLs: requires mod inst. cues for hand placement during transfer, did well with HHA ADL Comments: cues for thoroughness and completion of peri care, secondary to pt. showed visible confusion      OT Goals(current goals can now be found in the care plan section)    Visit Information  Last OT Received On: 10/20/13 History of Present Illness: Pt admit with CAP.    Subjective Data   "what does she think"? (asking for wife to answer question when i asked him)          Cognition  Cognition Arousal/Alertness: Awake/alert Behavior During Therapy: WFL for tasks  assessed/performed Overall Cognitive Status: Impaired/Different from baseline Area of Impairment: Memory;Following commands General Comments: wife reports hx of dementia and states he is presenting more confused now during this hospital stay    Mobility  Bed Mobility Overal bed mobility: Needs Assistance General bed mobility comments: s, hob flat and use of bed rail Transfers Overall transfer level: Needs assistance Transfers: Stand Pivot Transfers;Sit to/from Stand Sit to Stand: Min assist Stand pivot transfers: Min assist General transfer comment: cues for hand placement during pivot              End of Session OT - End of Session Activity Tolerance: Patient tolerated treatment well Patient left: in chair       Janice Coffin, COTA/L 10/20/2013, 12:42 PM

## 2013-10-20 NOTE — Progress Notes (Signed)
Family Medicine Teaching Service Daily Progress Note Intern Pager: [phone]  Patient name: SHASHANK KWASNIK Medical record number: [id number] Date of birth: [birth year] Age: 80 y.o. Gender: male  Primary Care Provider: Unice Cobble, MD Consultants: none   Code Status: DNR  Pt Overview and Major Events to Date:  1/24: Admitted for fatigue and flu with likely LLL PNA seen on CXR, 1 dose Rocephin/Azithro 1/25: rpt CXR- bibasilar pulmonary airspace disease, Switch to Vanc/Zosyn, Add Tamiflu 1/26: Blood Cx- NGTD, Influenza (+), Tamiflu cont, PT eval, Speech eval for poss chronic aspiration, cont abx  1/27: Cleared by Speech, cont abx, recheck CBC for thrombocytopenia, stress dose steroids, HIT panel  Assessment and Plan: GULED GAHAN is a 80 y.o. male presenting with Influenza +/- COPD exacerbation. PMH is significant for Alzheimer, HLD, Hx of prostate CA.   # Influenza +/- COPD exacerbation  - As influenza positive on admission, and had URI symptoms that began 3 weeks ago and now worsening with productive cough, decreased appetite and malaise per wife. Recently completed levaquin as outpatient on 1/20. CXR concerning for probable left lower lobe pneumonia.  Repeat CXR with bibasilar disease. - continue tamiflu for 5 days, vanc, and zosyn (1/24>>) - Duonebs TID; Incentive Spirometry  - Stress dose steroids today: Solu-Cortef 50mg  IV q6h - cont. PT/OT - BCx: NGTD - oxygen as needed to keep O2 sats >90%   # Dementia  - at baseline per wife  - Continue Aricept & Namenda   # Thrombocytopenia  - Plts 82 > 62 > clumped yesterday. Repeat today.  - Hgb wnl  - Pt on Heparin, consider HIT and draw HIT panel today   FEN/GI: D5NS @ 75; Reg Diet  Prophylaxis: Heparin    Disposition: Pending clinical improvement to [facility] with his wife  Subjective: Patient was sitting up in bed today, eating a graham cracker and drinking Boost. His wife and he state that he is much worse  today although he continues to appear pleasant and in no acute distress. She says he is becoming weaker.  Objective: Temp:  [97.7 F (36.5 C)-98.2 F (36.8 C)] 97.8 F (36.6 C) (01/27 0604) Pulse Rate:  [75-83] 75 (01/27 0604) Resp:  [19-20] 19 (01/27 0604) BP: (127-141)/(51-62) 141/62 mmHg (01/27 0604) SpO2:  [96 %-99 %] 98 % (01/27 0604) Weight:  [120 lb 8 oz (54.658 kg)] 120 lb 8 oz (54.658 kg) (01/26 2039) Physical Exam: General: alert, cooperative, NAD Cardiovascular: distant heart sounds, RRR, no murmurs Respiratory: normal work of breathing; scattered rhonchi throughout but no wheezing or crackles appreciated. Productive cough on deep inspiration. Abdomen: +BS Extremities: no edema, warm, tender to palpation  Laboratory:  Recent Labs Lab 10/17/13 1045 10/18/13 0506 10/19/13 1130  WBC 10.7* 7.7 6.4  HGB 13.2 12.9* 13.2  HCT 37.9* 37.6* 39.3  PLT 82* 62* PLATELET CLUMPS NOTED ON SMEAR, COUNT APPEARS DECREASED    Recent Labs Lab 10/17/13 1045 10/18/13 0506 10/19/13 1130  NA 139 140 138  K 4.7 4.4 5.0  CL 103 104 107  CO2 22 24 18*  BUN 25* 17 13  CREATININE 0.85 0.75 0.75  CALCIUM 8.8 8.2* 8.0*  PROT 5.8*  --   --   BILITOT 0.9  --   --   ALKPHOS 78  --   --   ALT 12  --   --   AST 40*  --   --   GLUCOSE 145* 82 130*    Influenza A +  Trop neg Pro-BNP 304  Blood cultures: NGTD  Imaging/Diagnostic Tests: CXR: 10/18/13 Bibasilar pulmonary airspace disease, without significant change.  COPD.  CXR: 10/17/13 1. Findings are concerning for probable left lower lobe pneumonia.  Repeat radiographs in 2-3 weeks following appropriate trial of  antimicrobial therapy is recommended to ensure complete resolution  of these findings.   Thank you, Charline Bills, Med Student 10/20/2013, 8:50 AM  I have seen and examined the patient and agree with the medical student's documentation above. MY annotations are in blue and my findings are listed below.   Gen: NAD,  alert, cooperative with exam HEENT: NCAT, mmm CV: RRR, distant Resp: non labored, scattered coarse sounds but no wheezes Abd: soft, + BS, voluntary guarding but states is not painful Ext: No edema, warm Neuro: Alert and oriented, No gross deficits  Mr. Convey is a 80 year old male admitted for post viral/flu pneumonia. He is on day 3 of treatment with vancomycin and Zosyn to cover high risk of MRSA infection and post influenza pneumonia. He continues to at least appear stable from a respiratory standpoint he reports worsening malaise and appetite today. Given his age and comorbidities we will continue broad-spectrum antibiotics and add stress dose steroids today. He has had repeated pneumonia since been cleared by speech pathology for swallowing dysfunction. Additionally he has thrombocytopenia and his platelet count continues to drop. Recently discharged from 134-[facility]. We've stopped heparin and sent a HIT and DIC panel.  Laroy Apple, MD [place] Resident, PGY-2 10/20/2013, 2:32 PM

## 2013-10-20 NOTE — Progress Notes (Signed)
FMTS Attending Daily Note: Dorcas Mcmurray MD [phone] pager office [phone] I  have seen and examined this patient, reviewed their chart. I have discussed this patient with the resident. I agree with the resident's findings, assessment and care plan. Will try stress dose steroids. Platelets clumped and will recheck. D/c SubQ heparin and start pneumatic compression, DIC and HIT panel. Spoke with his wife at length at bedside and answered all of her questions.

## 2013-10-20 NOTE — Clinical Social Work Psychosocial (Signed)
Clinical Social Work Department BRIEF PSYCHOSOCIAL ASSESSMENT 10/20/2013  Patient:  Vincent Lee, Vincent Lee     Account Number:  [id number]     Admit date:  10/17/2013  Clinical Social Worker:  Frederico Hamman  Date/Time:  10/20/2013 06:50 AM  Referred by:  Physician  Date Referred:  10/20/2013 Referred for  SNF Placement   Other Referral:   Interview type:  Family Other interview type:   CSW spoke with patient's wife Vincent Lee at the hospital.    PSYCHOSOCIAL DATA Living Status:  WIFE Admitted from facility:   Level of care:   Primary support name:  Vincent Lee Primary support relationship to patient:  SPOUSE Degree of support available:   Wife concerned about her ability to provide patient with appropriate care in their hoome at discharge.    CURRENT CONCERNS Current Concerns  Post-Acute Placement   Other Concerns:    SOCIAL WORK ASSESSMENT / PLAN CSW initially spoke with [place], SW at [facility] regarding patient and was advised that they have a bed for him. Per Narda Rutherford, wife expressed to a staff member that patient would need to go to the healthcare facility prior to coming home as she would not be able to adequately care for him.    CSW talked with Vincent Lee prior to her leaving for home after visiting with patient, and she confirmed with CSW her desire for patient to get ST rehab prior to coming home.   Assessment/plan status:  Psychosocial Support/Ongoing Assessment of Needs Other assessment/ plan:   Information/referral to community resources:   None needed or requested at this time.    PATIENT'S/FAMILY'S RESPONSE TO PLAN OF CARE: Vincent Lee is in agreement with patient going to [facility] facility for short-term rehab prior to returning home.

## 2013-10-20 NOTE — Progress Notes (Signed)
ANTIBIOTIC CONSULT NOTE - Follow-up  Pharmacy Consult for Vancomycin, Zosyn Indication: pneumonia  No Known Allergies  Patient Measurements: Height: 5\' 10"  (177.8 cm) Weight: 120 lb 8 oz (54.658 kg) IBW/kg (Calculated) : 73  Labs:  Recent Labs  10/18/13 0506 10/19/13 1130 10/20/13 1024  WBC 7.7 6.4 5.3  HGB 12.9* 13.2 12.9*  PLT 62* PLATELET CLUMPS NOTED ON SMEAR, COUNT APPEARS DECREASED 51*  CREATININE 0.75 0.75  --    Microbiology: Recent Results (from the past 720 hour(s))  CULTURE, BLOOD (ROUTINE X 2)     Status: None   Collection Time    10/17/13 10:45 AM      Result Value Range Status   Specimen Description BLOOD LEFT FOREARM   Final   Special Requests BOTTLES DRAWN AEROBIC AND ANAEROBIC 2.5CC   Final   Culture  Setup Time     Final   Value: 10/17/2013 22:09     Performed at [facility]   Culture     Final   Value:        BLOOD CULTURE RECEIVED NO GROWTH TO DATE CULTURE WILL BE HELD FOR 5 DAYS BEFORE ISSUING A FINAL NEGATIVE REPORT     Performed at [facility]   Report Status PENDING   Incomplete  CULTURE, BLOOD (ROUTINE X 2)     Status: None   Collection Time    10/17/13  1:01 PM      Result Value Range Status   Specimen Description BLOOD RIGHT ARM   Final   Special Requests BOTTLES DRAWN AEROBIC AND ANAEROBIC 10CC   Final   Culture  Setup Time     Final   Value: 10/17/2013 22:09     Performed at [facility]   Culture     Final   Value:        BLOOD CULTURE RECEIVED NO GROWTH TO DATE CULTURE WILL BE HELD FOR 5 DAYS BEFORE ISSUING A FINAL NEGATIVE REPORT     Performed at [facility]   Report Status PENDING   Incomplete   Assessment: Pt on Vancomycin and Zosyn (Day #4) for PNA. Pt also on tamiflu for positive flu. Afeb. WBC wnl.   Vancomycin trough 9.3 mcg/ml (subtherapeutic) on 1gm IV q12h.  Vanc 1/24>> Zosyn 1/24>> Tamiflu 1/24>>  1/24 BCx x 2 >ngtd  Goal of Therapy:  Vancomycin trough level 15-20  mcg/ml Appropriate Zosyn dosing  Plan:  - Continue Zosyn 3.375g IV q8h. Note due 1/30. - Change Vanc to 500mg  IV q8h - Will f/u micro data, pt's clinical condition, renal function, trough at new Css  Sherlon Handing, PharmD, BCPS Clinical pharmacist, pager [phone] 10/20/2013,1:18 PM

## 2013-10-21 DIAGNOSIS — D696 Thrombocytopenia, unspecified: Secondary | ICD-10-CM

## 2013-10-21 LAB — CBC
HCT: 36.1 % — ABNORMAL LOW (ref 39.0–52.0)
HEMOGLOBIN: 12.7 g/dL — AB (ref 13.0–17.0)
MCH: 29.7 pg (ref 26.0–34.0)
MCHC: 35.2 g/dL (ref 30.0–36.0)
MCV: 84.5 fL (ref 78.0–100.0)
Platelets: 44 10*3/uL — ABNORMAL LOW (ref 150–400)
RBC: 4.27 MIL/uL (ref 4.22–5.81)
RDW: 15.9 % — ABNORMAL HIGH (ref 11.5–15.5)
WBC: 4.6 10*3/uL (ref 4.0–10.5)

## 2013-10-21 LAB — GLUCOSE, CAPILLARY
GLUCOSE-CAPILLARY: 161 mg/dL — AB (ref 70–99)
Glucose-Capillary: 129 mg/dL — ABNORMAL HIGH (ref 70–99)

## 2013-10-21 MED ORDER — PREDNISONE 50 MG PO TABS
50.0000 mg | ORAL_TABLET | Freq: Once | ORAL | Status: AC
  Administered 2013-10-21: 50 mg via ORAL
  Filled 2013-10-21: qty 1

## 2013-10-21 MED ORDER — DOXYCYCLINE HYCLATE 100 MG PO TABS
100.0000 mg | ORAL_TABLET | Freq: Two times a day (BID) | ORAL | Status: DC
  Administered 2013-10-21 – 2013-10-22 (×3): 100 mg via ORAL
  Filled 2013-10-21 (×4): qty 1

## 2013-10-21 NOTE — Clinical Social Work Note (Signed)
CSW spoke with [place], SW with [facility] to give update, and clinicals transmitted . CSW advised that patient will have a bed at [facility] when ready for discharge.  Raschelle Wisenbaker Givens, MSW, LCSW [phone]

## 2013-10-21 NOTE — Progress Notes (Signed)
Physical Therapy Treatment Patient Details Name: Vincent Lee MRN: [id number] DOB: 1921-09-17 Today's Date: 10/21/2013 Time: 1517-6160 PT Time Calculation (min): 27 min  PT Assessment / Plan / Recommendation  History of Present Illness Pt admit with CAP.   PT Comments   Focus of treatment on balance.  Patient able to walk without walker, but still with increased fall risk and loss of balance x 1 with turns with walker.  Agree patient may benefit from STSNF as wife concerned with her ability to care for pt.  Will follow acutely.  Follow Up Recommendations  Supervision/Assistance - 24 hour;SNF     Does the patient have the potential to tolerate intense rehabilitation   N/A  Barriers to Discharge  None      Equipment Recommendations  None recommended by PT    Recommendations for Other Services  None  Frequency Min 3X/week   Progress towards PT Goals Progress towards PT goals: Progressing toward goals  Plan Discharge plan needs to be updated    Precautions / Restrictions Precautions Precautions: Fall Restrictions Weight Bearing Restrictions: No   Pertinent Vitals/Pain No pain    Mobility  Bed Mobility General bed mobility comments: up in chair Transfers Equipment used: Rolling walker (2 wheeled);None Transfers: Sit to/from Stand Sit to Stand: Min guard;Supervision General transfer comment: initially assist for safety up from low recliner with UE assist needed, then from elevated bed practiced without UE assist Ambulation/Gait Ambulation/Gait assistance: Min guard;Supervision Ambulation Distance (Feet): 300 Feet (and 100') Assistive device: Rolling walker (2 wheeled);None Gait Pattern/deviations: Step-through pattern;Decreased stride length;Trunk flexed;Drifts right/left;Narrow base of support General Gait Details: ambulated majority of hall with walker and cues occasionally for obstacle negotiation LOB with turning x 1; then 100' no device with minguard to  supervision occasional drifting right or left      PT Goals (current goals can now be found in the care plan section)    Visit Information  Last PT Received On: 10/21/13 Assistance Needed: +1 History of Present Illness: Pt admit with CAP.    Subjective Data   Was ballroom dancing a year and half ago!   Cognition  Cognition Arousal/Alertness: Awake/alert Behavior During Therapy: WFL for tasks assessed/performed Overall Cognitive Status: Impaired/Different from baseline Memory: Decreased short-term memory Following Commands: Follows one step commands with increased time;Follows multi-step commands with increased time General Comments: wife reports hx of dementia and states he is presenting more confused now during this hospital stay    Balance  Balance Standing balance-[name] Scale: Fair High level balance activites: Side stepping;Direction changes;Turns;Other (comment) High Level Balance Comments: tandem gait, marching and toe walking all with intermittent UE assist  End of Session PT - End of Session Equipment Utilized During Treatment: Gait belt Activity Tolerance: Patient tolerated treatment well Patient left: in chair;with call bell/phone within reach;with chair alarm set;with family/visitor present   GP     [facility] 10/21/2013, 12:01 PM Magda Kiel, [place] 10/21/2013

## 2013-10-21 NOTE — Progress Notes (Signed)
Family Medicine Teaching Service Daily Progress Note Intern Pager: [phone]  Patient name: Vincent Lee Medical record number: [id number] Date of birth: 1921-06-02 Age: 80 y.o. Gender: male  Primary Care Provider: Unice Cobble, MD Consultants: none   Code Status: DNR  Pt Overview and Major Events to Date:  1/24: Admitted for fatigue and flu with likely LLL PNA seen on CXR, 1 dose Rocephin/Azithro 1/25: rpt CXR- bibasilar pulmonary airspace disease, Switch to Vanc/Zosyn, Add Tamiflu 1/26: Blood Cx- NGTD, Influenza (+), Tamiflu cont, PT eval, Speech eval for poss chronic aspiration, cont abx  1/27: Cleared by Speech, cont abx, recheck CBC for thrombocytopenia, stress dose steroids, HIT panel  Assessment and Plan: Vincent Lee is a 80 y.o. male presenting with Influenza +/- COPD exacerbation. PMH is significant for Alzheimer, HLD, Hx of prostate CA.   # Influenza +/- COPD exacerbation  - As influenza positive on admission, and had URI symptoms that began 3 weeks ago and now worsening with productive cough, decreased appetite and malaise per wife. Recently completed levaquin as outpatient on 1/20. CXR concerning for probable left lower lobe pneumonia.  Repeat CXR with bibasilar disease. - continue tamiflu for 5 days - stop date 1/29 - ABX: vanc & zosyn (1/24>>1/28); Doxy started (1/28) - Duonebs TID; Incentive Spirometry  - Stress dose steroids started 1/28: Solu-Cortef 50mg  IV q6h; transition to Prednisone 50mg  qday; needs prolonged taper - BCx: NGTD - off O2  # Dementia  - at baseline per wife  - Continue Aricept & Namenda  - Safety concerns regarding return to [place] given deconditioning from acute illness; PT/OT to re-eval today for consideration of short term SNF placement.  Wife would favor this but may be more appropriate for return to [place] with [facility] PT/OT  # Thrombocytopenia  - Plts 82 > 62 >  - HIT Panel and DIC panels drawn; not consistent -  Elevated D-Dimer, normal Haptoglobin.  Likely Plt > 100 = spurious value as historically <100.  HIT less likely given <5 days - Heparin Stopped 1/27; SCDs; labs pending this AM  FEN/GI: Reg Diet; SL IV Prophylaxis: SCDs   Disposition: Pending clinical improvement.  Likely d/c on 1/29 following transition to PO meds; PT/OT to re-eval for ?SNF needs.    Subjective: Patient was sitting up in bed today, eating breakfast. Feeling much better.  Wife has significant concerns for safety with return to apartment living with [facility].  Would favor short term SNF placement.  Objective: Temp:  [97.1 F (36.2 C)-98.1 F (36.7 C)] 98.1 F (36.7 C) (01/28 0517) Pulse Rate:  [64-78] 64 (01/28 0517) Resp:  [18] 18 (01/28 0517) BP: (120-153)/(53-66) 153/54 mmHg (01/28 0517) SpO2:  [94 %-97 %] 94 % (01/28 0517) Weight:  [120 lb 8 oz (54.658 kg)] 120 lb 8 oz (54.658 kg) (01/27 2103) Physical Exam: General: alert, cooperative, NAD Cardiovascular: distant heart sounds, RRR, soft systolic ejection murmur Respiratory: normal work of breathing; scattered rhonchi throughout but no wheezing or crackles appreciated. Productive cough on deep inspiration. Abdomen: +BS Extremities: no edema, warm, tender to palpation, SCDs in place  Laboratory:  Recent Labs Lab 10/19/13 1130 10/20/13 1024 10/20/13 1204 10/21/13 0500  WBC 6.4 5.3  --  PENDING  HGB 13.2 12.9*  --  12.7*  HCT 39.3 37.5*  --  36.1*  PLT PLATELET CLUMPS NOTED ON SMEAR, COUNT APPEARS DECREASED 51* 53* PENDING    Recent Labs Lab 10/17/13 1045 10/18/13 0506 10/19/13 1130  NA  139 140 138  K 4.7 4.4 5.0  CL 103 104 107  CO2 22 24 18*  BUN 25* 17 13  CREATININE 0.85 0.75 0.75  CALCIUM 8.8 8.2* 8.0*  PROT 5.8*  --   --   BILITOT 0.9  --   --   ALKPHOS 78  --   --   ALT 12  --   --   AST 40*  --   --   GLUCOSE 145* 82 130*    Influenza A +  Trop neg Pro-BNP 304  Blood cultures: NGTD  Imaging/Diagnostic  Tests: CXR: 10/18/13 Bibasilar pulmonary airspace disease, without significant change.  COPD.  CXR: 10/17/13 1. Findings are concerning for probable left lower lobe pneumonia.  Repeat radiographs in 2-3 weeks following appropriate trial of  antimicrobial therapy is recommended to ensure complete resolution  of these findings.   Gerda Diss, DO [facility] Resident - PGY-3 10/21/2013 8:58 AM

## 2013-10-21 NOTE — Progress Notes (Signed)
FMTS Attending Daily Note: Dorcas Mcmurray MD [phone] pager office [phone] I  have seen and examined this patient, reviewed their chart. I have discussed this patient with the resident. I agree with the resident's findings, assessment and care plan. Significant improvement in overall clinical picture since we gave him stress dose steroids. He would need a very long taper off. I would do three-day stress dose and then taper him probably starting at 20 mg or 10 mg a day and then taper over 4-5 or 6 weeks. But he further 3 discharge it appears that he has a chronic thrombocytopenia. His DIC and HRT panel were negative. Regarding placement for disposition, I think the biggest obstacle is his rather severe dementia. He has been living at home but I'm not sure he can continue to do that. His wife is also elderly and has some health issues and I don't think she can adequately supervise him without significant amount of outside help, more than home health would provide. With that plan and up on PT/OT recommendations, will plan on SNF placement. I think this is what his wife wishes to do for the immediate time being anyway.

## 2013-10-22 DIAGNOSIS — F0390 Unspecified dementia without behavioral disturbance: Secondary | ICD-10-CM

## 2013-10-22 DIAGNOSIS — J1100 Influenza due to unidentified influenza virus with unspecified type of pneumonia: Secondary | ICD-10-CM | POA: Diagnosis present

## 2013-10-22 DIAGNOSIS — E2740 Unspecified adrenocortical insufficiency: Secondary | ICD-10-CM

## 2013-10-22 DIAGNOSIS — Z8546 Personal history of malignant neoplasm of prostate: Secondary | ICD-10-CM

## 2013-10-22 DIAGNOSIS — E2749 Other adrenocortical insufficiency: Secondary | ICD-10-CM

## 2013-10-22 LAB — HEPARIN INDUCED THROMBOCYTOPENIA PNL
Heparin Induced Plt Ab: NEGATIVE
PATIENT O. D.: 0.043
UFH HIGH DOSE UFH H: 0 %
UFH Low Dose 0.1 IU/mL: 0 % Release
UFH Low Dose 0.5 IU/mL: 0 % Release
UFH SRA Result: NEGATIVE

## 2013-10-22 LAB — GLUCOSE, CAPILLARY: GLUCOSE-CAPILLARY: 99 mg/dL (ref 70–99)

## 2013-10-22 MED ORDER — ACETAMINOPHEN 325 MG PO TABS: 650.0000 mg | ORAL_TABLET | Freq: Four times a day (QID) | ORAL | Status: DC | PRN

## 2013-10-22 MED ORDER — DOXYCYCLINE HYCLATE 100 MG PO TABS: 100.0000 mg | ORAL_TABLET | Freq: Two times a day (BID) | ORAL | Status: AC

## 2013-10-22 MED ORDER — PREDNISONE 50 MG PO TABS
50.0000 mg | ORAL_TABLET | Freq: Once | ORAL | Status: AC
  Administered 2013-10-22: 50 mg via ORAL
  Filled 2013-10-22: qty 1

## 2013-10-22 MED ORDER — ALBUTEROL SULFATE (2.5 MG/3ML) 0.083% IN NEBU: 2.5000 mg | INHALATION_SOLUTION | Freq: Four times a day (QID) | RESPIRATORY_TRACT | Status: AC | PRN

## 2013-10-22 MED ORDER — GUAIFENESIN ER 600 MG PO TB12: 600.0000 mg | ORAL_TABLET | Freq: Two times a day (BID) | ORAL | Status: DC

## 2013-10-22 MED ORDER — PREDNISONE 10 MG PO TABS: ORAL_TABLET | ORAL | Status: DC

## 2013-10-22 NOTE — Progress Notes (Signed)
Report called to Tere at [facility] portion of [facility], [phone]. IV removed. Pt left hospital in personal clothing, wearing dentures and right hearing aid. Pt spouse states she has left hearing aid and glasses. All other belongings taken by wife. Pt taken via PTAR.

## 2013-10-22 NOTE — Care Management Note (Signed)
   CARE MANAGEMENT NOTE 10/22/2013  Patient:  Vincent Lee,Vincent Lee   Account Number:  [id number]  Date Initiated:  10/22/2013  Documentation initiated by:  Jaid Quirion  Subjective/Objective Assessment:   Pt is resident of [facility], where he lives in Independent Living with his wife.     Action/Plan:   Pt wife is unable to provide all needs for this pt and because of his need for intense rehab and supervision, he will d/Lee to SNF leve of care at [facility].   Anticipated DC Date:  10/22/2013   Anticipated DC Plan:  SKILLED NURSING FACILITY         Choice offered to / List presented to:             Status of service:  Completed, signed off Medicare Important Message given?   (If response is "NO", the following Medicare IM given date fields will be blank) Date Medicare IM given:   Date Additional Medicare IM given:    Discharge Disposition:  SKILLED NURSING FACILITY  Per UR Regulation:    If discussed at Long Length of Stay Meetings, dates discussed:    Comments:    

## 2013-10-22 NOTE — Discharge Summary (Signed)
Family Medicine Teaching Service  Discharge Note : Attending Niurka Benecke MD Pager [phone] Office [phone] I have seen and examined this patient, reviewed their chart and discussed discharge planning wit the resident at the time of discharge. I agree with the discharge plan as above.  

## 2013-10-22 NOTE — Progress Notes (Deleted)
Attempted to wean patient to see if she can tolerate room air, 30 minutes on room air at rest pt SpO2 86%. Replaced O2 at 2L, SpO2 returned to 95%. Will continue to monitor.

## 2013-10-22 NOTE — Clinical Social Work Placement (Signed)
Clinical Social Work Department CLINICAL SOCIAL WORK PLACEMENT NOTE 10/22/2013  Patient:  Vincent Lee, Vincent Lee  Account Number:  [id number] Admit date:  10/17/2013  Clinical Social Worker:  Crawford Givens, LCSW  Date/time:  10/22/2013 10:22 AM  Clinical Social Work is seeking post-discharge placement for this patient at the following level of care:   SKILLED NURSING   (*CSW will update this form in Epic as items are completed)   10/21/2013  Patient/family provided with [place] Department of Clinical Social Work's list of facilities offering this level of care within the geographic area requested by the patient (or if unable, by the patient's family).  10/21/2013  Patient/family informed of their freedom to choose among providers that offer the needed level of care, that participate in Medicare, Medicaid or managed care program needed by the patient, have an available bed and are willing to accept the patient.    Patient/family informed of [facility]' ownership interest in [facility], as well as of the fact that they are under no obligation to receive care at this facility.  PASARR submitted to EDS on 10/21/2013 PASARR number received from EDS on 10/21/2013  FL2 transmitted to all facilities in geographic area requested by pt/family on   FL2 transmitted to all facilities within larger geographic area on   Patient informed that his/her managed care company has contracts with or will negotiate with  certain facilities, including the following:     Patient/family informed of bed offers received:  10/21/2013 Patient chooses bed at [facility] Physician recommends and patient chooses bed at    Patient to be transferred to [facility] on  10/22/2013 Patient to be transferred to facility by ambulance  The following physician request were entered in Epic:   Additional Comments:  Wellington Hampshire, [place] Intern.

## 2013-10-22 NOTE — Discharge Summary (Signed)
[facility] Discharge Summary  Patient name: Vincent Lee Medical record number: [id number] Date of birth: 1921/06/28 Age: 80 y.o. Gender: male Date of Admission: 10/17/2013  Date of Discharge: 10/22/13 Admitting Physician: Zigmund Gottron, MD  Primary Care Provider: Unice Cobble, MD Consultants: None  Indication for Hospitalization: Malaise & Productive Cough  Discharge Diagnoses/Problem List:  Principal Problem:   Influenza with pneumonia Active Problems:   Anemia, unspecified   Thrombocytopenia, unspecified   Dementia   Prostate Cancer, Personal History of - S/p Seed implants 2000   CAP (community acquired pneumonia)   Adrenal insufficiency, presumed  Disposition: To SNF  Discharge Condition: Improved  Day of Discharge Information: Subjective: Feeling much better.  Wife agreeable to discharge to [facility] SN[facility]. Goal to return to Apartment living after short term rehab.  Objective: Temp:  [97.4 F (36.3 C)-98 F (36.7 C)] 98 F (36.7 C) (01/29 0513) Pulse Rate:  [71-83] 83 (01/29 0513) Resp:  [18-20] 18 (01/29 0513) BP: (124-127)/(45-61) 124/45 mmHg (01/29 0513) SpO2:  [92 %-96 %] 92 % (01/29 0513) Weight:  [132 lb 6.4 oz (60.056 kg)] 132 lb 6.4 oz (60.056 kg) (01/28 2148) Physical Exam: General: alert, cooperative, NAD Cardiovascular: distant heart sounds, RRR, soft systolic ejection murmur Respiratory: normal work of breathing; scattered rhonchi throughout but no wheezing or crackles appreciated.  Abdomen: +BS Extremities: no edema, warm, tender to palpation, SCDs in place   Brief Hospital Course:  1/24: Admitted for fatigue and flu with likely LLL PNA seen on CXR, 1 dose Rocephin/Azithro 1/25: rpt CXR- bibasilar pulmonary airspace disease, Switch to Vanc/Zosyn, Add Tamiflu 1/26: Blood Cx- NGTD, Influenza (+), Tamiflu cont, PT eval, Speech eval for poss chronic aspiration, cont abx  1/27: Cleared by Speech, cont  abx, recheck CBC for thrombocytopenia, stress dose steroids, HIT panel  Assessment and Plan: Vincent Lee is a 80 y.o. male presenting with Influenza +/- COPD exacerbation. PMH is significant for Alzheimer, HLD, Hx of prostate CA.   # Influenza +/- COPD exacerbation  - As influenza positive on admission, and had URI symptoms that began 3 weeks ago and now worsening with productive cough, decreased appetite and malaise per wife. Recently completed levaquin as outpatient on 1/20. CXR concerning for probable left lower lobe pneumonia.  Repeat CXR with bibasilar disease. - continue tamiflu for 5 days - stop date 1/29 - ABX: vanc & zosyn (1/24>>1/28); Doxy started (1/28); Needs total of 14 days - Treated with Duonebs and IS acutely.  Initially had O2 requirement but was weaned off for >48 hours prior to d/c - Stress dose steroids started 1/28: Solu-Cortef 50mg  IV q6h; transition to Prednisone 50mg  qday; needs prolonged taper given recent use and marked improvement in overall function after starting - ST eval = no concerns for silent aspiration - BCx: NGTD  # Dementia  - at baseline per wife  - Continue Aricept & Namenda  - Safety concerns regarding return to [place] given deconditioning from acute illness; PT/OT to eval recommended SNF placement.  Hopeful will return to [place] after SNF but significant safety and supervision concerns given level of dementia and need for 24 hour supervision.  # Thrombocytopenia   Recent Labs  02/15/13 0820 02/23/13 1426 10/14/13 1133 10/17/13 1045 10/18/13 0506 10/19/13 1130 10/20/13 1024 10/20/13 1204 10/21/13 0500  PLT 35* 71.0 Repeated and verified X2.* 134* 82* 62* PLATELET CLUMPS NOTED ON SMEAR, COUNT APPEARS DECREASED 51* 53* 44*  - HIT Panel and DIC  panels drawn given >50% drop in platelets but 134 Value likely spurious and due to hemoconcentration; time frame not consistent with HIT but Anti-bodies pending - Elevated  D-Dimer, normal Haptoglobin.  - Heparin Stopped 1/27; SCDs - Continue to monitor especially after starting steroids and concern for BM suppression.   - Known hx of Prostate CA with unclear follow up.  - Defer further evaluation to OP setting once over acute illness.  Issues for Follow Up:  Thrombocytopenia Safety at Home  Respiratory Status  Significant Procedures: None  Significant Labs and Imaging:   Recent Labs Lab 10/19/13 1130 10/20/13 1024 10/20/13 1204 10/21/13 0500  WBC 6.4 5.3  --  4.6  HGB 13.2 12.9*  --  12.7*  HCT 39.3 37.5*  --  36.1*  PLT PLATELET CLUMPS NOTED ON SMEAR, COUNT APPEARS DECREASED 51* 53* 44*    Recent Labs Lab 10/17/13 1045 10/18/13 0506 10/19/13 1130  NA 139 140 138  K 4.7 4.4 5.0  CL 103 104 107  CO2 22 24 18*  GLUCOSE 145* 82 130*  BUN 25* 17 13  CREATININE 0.85 0.75 0.75  CALCIUM 8.8 8.2* 8.0*  ALKPHOS 78  --   --   AST 40*  --   --   ALT 12  --   --   ALBUMIN 3.1*  --   --    DIC Panel  10/20/2013 12:04  Platelets 53 (L)  Smear Review Rare schistocytes noted  D-Dimer, Quant 6.46 (H)  Fibrinogen 402  Prothrombin Time 15.3 (H)  INR 1.24  APTT 33     10/17/2013 12:05  Influenza A By PCR POSITIVE (A)  Influenza B By PCR NEGATIVE  H1N1 flu by pcr NOT DETECTED    10/18/2013 05:06  HIV NON REACTIVE    10/17/2013 11:28  Color, Urine AMBER (A)  APPearance CLOUDY (A)  Specific Gravity, Urine 1.028  pH 5.0  Glucose NEGATIVE  Bilirubin Urine NEGATIVE  Ketones, ur NEGATIVE  Protein 30 (A)  Urobilinogen, UA 0.2  Nitrite NEGATIVE  Leukocytes, UA NEGATIVE  Hgb urine dipstick NEGATIVE  Squamous Epithelial / LPF RARE  Bacteria, UA RARE  Casts HYALINE CASTS (A)    Recent Labs Lab 10/17/13 1045  TROPONINI <0.30  PROBNP 304.2   Outstanding Results:  HIT Panel Pending  Discharge Medications:    Medication List         acetaminophen 325 MG tablet  Commonly known as:  TYLENOL  Take 2 tablets (650 mg total) by mouth  every 6 (six) hours as needed for mild pain (or Fever >/= 101).     albuterol (2.5 MG/3ML) 0.083% nebulizer solution  Commonly known as:  PROVENTIL  Take 3 mLs (2.5 mg total) by nebulization every 6 (six) hours as needed for wheezing or shortness of breath.     donepezil 5 MG tablet  Commonly known as:  ARICEPT  Take 5 mg by mouth at bedtime.     doxycycline 100 MG tablet  Commonly known as:  VIBRA-TABS  Take 1 tablet (100 mg total) by mouth every 12 (twelve) hours. 14 Days total ABX: 1/25 - 2/8     guaiFENesin 600 MG 12 hr tablet  Commonly known as:  MUCINEX  Take 1 tablet (600 mg total) by mouth 2 (two) times daily.     memantine 10 MG tablet  Commonly known as:  NAMENDA  Take 10 mg by mouth daily.     MULTIVITAMIN PO  Take by mouth daily.  predniSONE 10 MG tablet  Commonly known as:  DELTASONE  All po: 40mg  daily X 4 days, then 20mg  daily X 4 days, then 10mg  daily X 4 days, then 5mg  daily X 4 days        Discharge Instructions: Please refer to Patient Instructions section of EMR for full details.  Patient was counseled important signs and symptoms that should prompt return to medical care, changes in medications, dietary instructions, activity restrictions, and follow up appointments.   Follow-Up Appointments: Per SNF Physician  Gerda Diss, DO 10/22/2013, 8:56 AM PGY-3, [facility]

## 2013-10-23 ENCOUNTER — Non-Acute Institutional Stay (SKILLED_NURSING_FACILITY): Payer: Medicare Other | Admitting: Nurse Practitioner

## 2013-10-23 ENCOUNTER — Encounter: Payer: Self-pay | Admitting: Nurse Practitioner

## 2013-10-23 DIAGNOSIS — F0390 Unspecified dementia without behavioral disturbance: Secondary | ICD-10-CM

## 2013-10-23 DIAGNOSIS — D696 Thrombocytopenia, unspecified: Secondary | ICD-10-CM

## 2013-10-23 DIAGNOSIS — Z8546 Personal history of malignant neoplasm of prostate: Secondary | ICD-10-CM

## 2013-10-23 DIAGNOSIS — J1100 Influenza due to unidentified influenza virus with unspecified type of pneumonia: Secondary | ICD-10-CM

## 2013-10-23 DIAGNOSIS — D649 Anemia, unspecified: Secondary | ICD-10-CM

## 2013-10-23 LAB — CULTURE, BLOOD (ROUTINE X 2)
Culture: NO GROWTH
Culture: NO GROWTH

## 2013-10-23 NOTE — Assessment & Plan Note (Signed)
Update CBC. 

## 2013-10-23 NOTE — Assessment & Plan Note (Signed)
Down to 36 while in hospital-repeat CBC

## 2013-10-23 NOTE — Assessment & Plan Note (Signed)
Prostate Cancer, Personal History of - S/p Seed implants 2000

## 2013-10-23 NOTE — Progress Notes (Signed)
Patient ID: Vincent Lee, male   DOB: 06/15/1921, 81 y.o.   MRN: [id number]   Code Status: DNR  No Known Allergies  Chief Complaint  Patient presents with  . Medical Managment of Chronic Issues    PNA  . Acute Visit  . Hospitalization Follow-up    HPI: Patient is a 80 y.o. male seen in the SNF at [facility] today for evaluation of  Treated a Influenza +/- COPD exacerbation and other chronic medical conditions. Hospitalized from 10/17/2013 to 10/22/13 for Malaise & Productive Cough -Influenza with pneumonia-improved upon discharge.   Problem List Items Addressed This Visit   Anemia, unspecified     Update CBC    Dementia (Chronic)     - at baseline per wife PMH is significant for Alzheimer - Continue Aricept & Namenda  - Safety concerns regarding return to [place] given deconditioning from acute illness; PT/OT to eval recommended SNF placement. Hopeful will return to [facility] after SNF but significant safety and supervision concerns given level of dementia and need for 24 hour supervision.  -will obtain TSH      Influenza with pneumonia      As influenza positive on admission, and had URI symptoms that began 3 weeks ago and now worsening with productive cough, decreased appetite and malaise per wife. Recently completed levaquin as outpatient on 1/20. CXR concerning for probable left lower lobe pneumonia. Repeat CXR with bibasilar disease.  - tamiflu for 5 days - stopped date 1/29  - ABX: vanc & zosyn (1/24>>1/28); Doxy started (1/28); Needs total of 14 days  - Treated with Duonebs and IS acutely. Initially had O2 requirement but was weaned off for >48 hours prior to d/c  - Stress dose steroids started 1/28: Solu-Cortef 50mg  IV q6h; transition to Prednisone 50mg  qday; needs prolonged taper given recent use and marked improvement in overall function after starting      Prostate Cancer, Personal History of - S/p Seed implants 2000     Prostate Cancer,  Personal History of - S/p Seed implants 2000    Thrombocytopenia, unspecified - Primary     Down to 53 while in hospital-repeat CBC       Review of Systems:  Review of Systems  Constitutional: Positive for malaise/fatigue. Negative for fever, chills, weight loss and diaphoresis.  HENT: Positive for hearing loss. Negative for congestion, ear discharge, ear pain, nosebleeds, sore throat and tinnitus.   Eyes: Negative for blurred vision, double vision, photophobia, pain, discharge and redness.  Respiratory: Positive for cough. Negative for hemoptysis, sputum production, shortness of breath, wheezing and stridor.   Cardiovascular: Negative for chest pain, palpitations, orthopnea, claudication, leg swelling and PND.  Gastrointestinal: Negative for heartburn, nausea, vomiting, abdominal pain, diarrhea, constipation, blood in stool and melena.  Genitourinary: Negative for dysuria, urgency, frequency, hematuria and flank pain.  Musculoskeletal: Negative for back pain, falls, joint pain, myalgias and neck pain.  Skin: Negative for itching and rash.  Neurological: Negative for dizziness, tingling, tremors, sensory change, speech change, focal weakness, seizures, loss of consciousness, weakness and headaches.  Endo/Heme/Allergies: Negative for environmental allergies and polydipsia. Does not bruise/bleed easily.  Psychiatric/Behavioral: Positive for memory loss. Negative for depression, suicidal ideas, hallucinations and substance abuse. The patient is not nervous/anxious and does not have insomnia.      Past Medical History  Diagnosis Date  . Alzheimer disease   . Hyperlipidemia   . H/O prostate cancer   . Cataract   . Cancer of  skin     HAD FOR YEARS, REMOVED FOR YEARS   Past Surgical History  Procedure Laterality Date  . High dose radiation implant insertion      for prostate ca  . Prostate surgery     Social History:   reports that he has never smoked. He has never used smokeless  tobacco. He reports that he does not drink alcohol or use illicit drugs.  Family History  Problem Relation Age of Onset  . Cancer Brother      ? lung  . Diabetes Brother     not definite  . Heart disease Neg Hx   . Stroke Neg Hx   . Dementia Mother   . Diverticulitis Father     Medications: Patient's Medications  New Prescriptions   No medications on file  Previous Medications   ACETAMINOPHEN (TYLENOL) 325 MG TABLET    Take 2 tablets (650 mg total) by mouth every 6 (six) hours as needed for mild pain (or Fever >/= 101).   ALBUTEROL (PROVENTIL) (2.5 MG/3ML) 0.083% NEBULIZER SOLUTION    Take 3 mLs (2.5 mg total) by nebulization every 6 (six) hours as needed for wheezing or shortness of breath.   DONEPEZIL (ARICEPT) 5 MG TABLET    Take 5 mg by mouth at bedtime.   DOXYCYCLINE (VIBRA-TABS) 100 MG TABLET    Take 1 tablet (100 mg total) by mouth every 12 (twelve) hours. 14 Days total ABX: 1/25 - 2/8   GUAIFENESIN (MUCINEX) 600 MG 12 HR TABLET    Take 1 tablet (600 mg total) by mouth 2 (two) times daily.   MEMANTINE (NAMENDA) 10 MG TABLET    Take 10 mg by mouth daily.   MULTIPLE VITAMINS-MINERALS (MULTIVITAMIN PO)    Take by mouth daily.   PREDNISONE (DELTASONE) 10 MG TABLET    All po: 40mg  daily X 4 days, then 20mg  daily X 4 days, then 10mg  daily X 4 days, then 5mg  daily X 4 days  Modified Medications   No medications on file  Discontinued Medications   No medications on file     Physical Exam: Physical Exam  Constitutional: He appears well-developed and well-nourished. No distress.  HENT:  Head: Normocephalic and atraumatic.  Right Ear: External ear normal.  Left Ear: External ear normal.  Nose: Nose normal.  Mouth/Throat: Oropharynx is clear and moist. No oropharyngeal exudate.  Eyes: Conjunctivae and EOM are normal. Pupils are equal, round, and reactive to light. Right eye exhibits no discharge. Left eye exhibits no discharge. No scleral icterus.  Neck: Normal range of motion.  Neck supple. No JVD present. No tracheal deviation present. No thyromegaly present.  Cardiovascular: Normal rate, regular rhythm and intact distal pulses.  Exam reveals no gallop and no friction rub.   Murmur heard. 2-3 systolic ejection murmur  Pulmonary/Chest: Effort normal and breath sounds normal. No stridor. No respiratory distress. He has no wheezes. He has no rales. He exhibits no tenderness.  Abdominal: Soft. Bowel sounds are normal. He exhibits no distension. There is no tenderness. There is no rebound and no guarding.  Musculoskeletal: Normal range of motion. He exhibits no edema and no tenderness.  Lymphadenopathy:    He has no cervical adenopathy.  Neurological: He is alert. He has normal reflexes. He displays normal reflexes. No cranial nerve deficit. He exhibits normal muscle tone. Coordination normal.  Skin: Skin is warm and dry. No rash noted. He is not diaphoretic. No erythema. No pallor.  Psychiatric: He has a normal  mood and affect. His behavior is normal. Thought content normal. His mood appears not anxious. His affect is not angry, not blunt, not labile and not inappropriate. His speech is not delayed, not tangential and not slurred. He is not agitated, not aggressive, not hyperactive, not slowed, not withdrawn, not actively hallucinating and not combative. Thought content is not paranoid and not delusional. Cognition and memory are impaired. He expresses inappropriate judgment. He does not express impulsivity. He does not exhibit a depressed mood. He expresses no homicidal and no suicidal ideation. He expresses no suicidal plans and no homicidal plans. He is communicative. He exhibits abnormal recent memory. He is attentive.    Filed Vitals:   10/23/13 1133  BP: 140/90  Pulse: 82  Temp: 98.3 F (36.8 C)  TempSrc: Tympanic  Resp: 18      Labs reviewed: Basic Metabolic Panel:  Recent Labs  09/14/13 1503  10/17/13 1045 10/18/13 0506 10/19/13 1130  NA 142  < > 139  140 138  K 4.1  < > 4.7 4.4 5.0  CL 107  < > 103 104 107  CO2 28  --  22 24 18*  GLUCOSE 113*  < > 145* 82 130*  BUN 18  < > 25* 17 13  CREATININE 0.8  < > 0.85 0.75 0.75  CALCIUM 9.5  --  8.8 8.2* 8.0*  TSH 0.98  --   --   --   --   < > = values in this interval not displayed. Liver Function Tests:CBC:  Recent Labs  02/23/13 1426  10/14/13 1133  10/17/13 1045  10/19/13 1130 10/20/13 1024 10/20/13 1204 10/21/13 0500  WBC 4.6  < > 9.9  --  10.7*  < > 6.4 5.3  --  4.6  NEUTROABS 2.3  --  2.9  --  4.2  --   --   --   --   --   HGB 13.1  < > 13.6  < > 13.2  < > 13.2 12.9*  --  12.7*  HCT 39.6  < > 40.5  < > 37.9*  < > 39.3 37.5*  --  36.1*  MCV 93.0  < > 88.6  --  85.4  < > 88.9 86.6  --  84.5  PLT 71.0 Repeated and verified X2.*  --  134*  --  82*  < > PLATELET CLUMPS NOTED ON SMEAR, COUNT APPEARS DECREASED 51* 53* 44*  < > = values in this interval not displayed.  Past Procedures:  10/18/13 CXR  IMPRESSION:  Bibasilar pulmonary airspace disease, without significant change.  COPD.   Assessment/Plan Thrombocytopenia, unspecified Down to 9 while in hospital-repeat CBC  Dementia - at baseline per wife PMH is significant for Alzheimer - Continue Aricept & Namenda  - Safety concerns regarding return to [place] given deconditioning from acute illness; PT/OT to eval recommended SNF placement. Hopeful will return to [facility] after SNF but significant safety and supervision concerns given level of dementia and need for 24 hour supervision.  -will obtain TSH    Influenza with pneumonia  As influenza positive on admission, and had URI symptoms that began 3 weeks ago and now worsening with productive cough, decreased appetite and malaise per wife. Recently completed levaquin as outpatient on 1/20. CXR concerning for probable left lower lobe pneumonia. Repeat CXR with bibasilar disease.  - tamiflu for 5 days - stopped date 1/29  - ABX: vanc & zosyn (1/24>>1/28);  Doxy started (1/28); Needs total  of 14 days  - Treated with Duonebs and IS acutely. Initially had O2 requirement but was weaned off for >48 hours prior to d/c  - Stress dose steroids started 1/28: Solu-Cortef 50mg  IV q6h; transition to Prednisone 50mg  qday; needs prolonged taper given recent use and marked improvement in overall function after starting    Prostate Cancer, Personal History of - S/p Seed implants 2000 Prostate Cancer, Personal History of - S/p Seed implants 2000  Anemia, unspecified Update CBC    Family/ Staff Communication: observe the patient.   Goals of Care: SNF  Labs/tests ordered: CBC and TSH

## 2013-10-23 NOTE — Assessment & Plan Note (Signed)
As influenza positive on admission, and had URI symptoms that began 3 weeks ago and now worsening with productive cough, decreased appetite and malaise per wife. Recently completed levaquin as outpatient on 1/20. CXR concerning for probable left lower lobe pneumonia. Repeat CXR with bibasilar disease.  - tamiflu for 5 days - stopped date 1/29  - ABX: vanc & zosyn (1/24>>1/28); Doxy started (1/28); Needs total of 14 days  - Treated with Duonebs and IS acutely. Initially had O2 requirement but was weaned off for >48 hours prior to d/c  - Stress dose steroids started 1/28: Solu-Cortef 50mg  IV q6h; transition to Prednisone 50mg  qday; needs prolonged taper given recent use and marked improvement in overall function after starting

## 2013-10-23 NOTE — Assessment & Plan Note (Addendum)
-   at baseline per wife PMH is significant for Alzheimer - Continue Aricept & Namenda  - Safety concerns regarding return to [place] given deconditioning from acute illness; PT/OT to eval recommended SNF placement. Hopeful will return to [facility] after SNF but significant safety and supervision concerns given level of dementia and need for 24 hour supervision.  -will obtain TSH

## 2013-10-26 LAB — CBC AND DIFFERENTIAL
HCT: 33 % — AB (ref 41–53)
Hemoglobin: 11.8 g/dL — AB (ref 13.5–17.5)
Platelets: 77 10*3/uL — AB (ref 150–399)
WBC: 8.9 10^3/mL

## 2013-10-26 LAB — TSH: TSH: 0.34 u[IU]/mL — AB (ref 0.41–5.90)

## 2013-11-10 ENCOUNTER — Non-Acute Institutional Stay (SKILLED_NURSING_FACILITY): Payer: Medicare Other | Admitting: Nurse Practitioner

## 2013-11-10 ENCOUNTER — Encounter: Payer: Self-pay | Admitting: Nurse Practitioner

## 2013-11-10 DIAGNOSIS — E2740 Unspecified adrenocortical insufficiency: Secondary | ICD-10-CM

## 2013-11-10 DIAGNOSIS — F0390 Unspecified dementia without behavioral disturbance: Secondary | ICD-10-CM

## 2013-11-10 DIAGNOSIS — D696 Thrombocytopenia, unspecified: Secondary | ICD-10-CM

## 2013-11-10 DIAGNOSIS — E2749 Other adrenocortical insufficiency: Secondary | ICD-10-CM

## 2013-11-10 DIAGNOSIS — D649 Anemia, unspecified: Secondary | ICD-10-CM

## 2013-11-10 DIAGNOSIS — J189 Pneumonia, unspecified organism: Secondary | ICD-10-CM

## 2013-11-10 NOTE — Progress Notes (Signed)
Patient ID: Vincent Lee, male   DOB: 05-Mar-1921, 80 y.o.   MRN: [id number]   Code Status: DNR  No Known Allergies  Chief Complaint  Patient presents with  . Medical Managment of Chronic Issues    low Bp  . Dementia    HPI: Patient is a 80 y.o. male seen in the SNF at [facility] today for evaluation of  Bp 79/60 and other chronic medical conditions. Hospitalized from 10/17/2013 to 10/22/13 for Malaise & Productive Cough -Influenza with pneumonia-improved upon discharge.   Problem List Items Addressed This Visit   Thrombocytopenia, unspecified     Down to 43 while in hospital-77 at SNF      Dementia (Chronic)      at baseline per wife PMH is significant for Alzheimer  Continue Aricept & Namenda   Safety concerns regarding return to [place] given deconditioning from acute illness; PT/OT to eval recommended SNF placement. [facility] will [facility] but significant safety and supervision concerns given level of dementia and need for 24 hour supervision.   will obtain TSH    CAP (community acquired pneumonia)     Repeat CXRAs. Last hospitalization:  influenza positive,  had URI symptom with worsening with productive cough, decreased appetite and malaise per wife.             Recently completed levaquin as outpatient on 1/20. CXR concerning for probable left lower lobe pneumonia. Repeat CXR with bibasilar disease.               treated with tamiflu for 5 days - stopped date 1/29     ABX: vanc & zosyn (1/24>>1/28); Doxy started (1/28); Needs total of 14 days     Treated with Duonebs and IS, Initially had O2 requirement but was weaned off for >48 hours prior to d/c     Stress dose steroids started 1/28: Solu-Cortef 50mg  IV q6h; transition to Prednisone 50mg  qday; needs prolonged taper given recent use and marked improvement in overall function after starting        Anemia, unspecified     Hgb 11.8 10/26/13    Adrenal insufficiency, presumed -  Primary     Staff reported Bp 79/60, he is asymptomatic, will obtain Cath UA C/S, CBC, CMP       Review of Systems:  Review of Systems  Constitutional: Positive for malaise/fatigue. Negative for fever, chills, weight loss and diaphoresis.  HENT: Positive for hearing loss. Negative for congestion, ear discharge, ear pain, nosebleeds, sore throat and tinnitus.   Eyes: Negative for blurred vision, double vision, photophobia, pain, discharge and redness.  Respiratory: Positive for cough. Negative for hemoptysis, sputum production, shortness of breath, wheezing and stridor.   Cardiovascular: Negative for chest pain, palpitations, orthopnea, claudication, leg swelling and PND.  Gastrointestinal: Negative for heartburn, nausea, vomiting, abdominal pain, diarrhea, constipation, blood in stool and melena.  Genitourinary: Negative for dysuria, urgency, frequency, hematuria and flank pain.  Musculoskeletal: Negative for back pain, falls, joint pain, myalgias and neck pain.  Skin: Negative for itching and rash.  Neurological: Negative for dizziness, tingling, tremors, sensory change, speech change, focal weakness, seizures, loss of consciousness, weakness and headaches.  Endo/Heme/Allergies: Negative for environmental allergies and polydipsia. Does not bruise/bleed easily.  Psychiatric/Behavioral: Positive for memory loss. Negative for depression, suicidal ideas, hallucinations and substance abuse. The patient is not nervous/anxious and does not have insomnia.      Past Medical History  Diagnosis Date  .  Alzheimer disease   . Hyperlipidemia   . H/O prostate cancer   . Cataract   . Cancer of skin     HAD FOR YEARS, REMOVED FOR YEARS   Past Surgical History  Procedure Laterality Date  . High dose radiation implant insertion      for prostate ca  . Prostate surgery     Social History:   reports that he has never smoked. He has never used smokeless tobacco. He reports that he does not drink  alcohol or use illicit drugs.  Family History  Problem Relation Age of Onset  . Cancer Brother      ? lung  . Diabetes Brother     not definite  . Heart disease Neg Hx   . Stroke Neg Hx   . Dementia Mother   . Diverticulitis Father     Medications: Patient's Medications  New Prescriptions   No medications on file  Previous Medications   ACETAMINOPHEN (TYLENOL) 325 MG TABLET    Take 2 tablets (650 mg total) by mouth every 6 (six) hours as needed for mild pain (or Fever >/= 101).   ALBUTEROL (PROVENTIL) (2.5 MG/3ML) 0.083% NEBULIZER SOLUTION    Take 3 mLs (2.5 mg total) by nebulization every 6 (six) hours as needed for wheezing or shortness of breath.   DONEPEZIL (ARICEPT) 5 MG TABLET    Take 5 mg by mouth at bedtime.   GUAIFENESIN (MUCINEX) 600 MG 12 HR TABLET    Take 1 tablet (600 mg total) by mouth 2 (two) times daily.   MEMANTINE (NAMENDA) 10 MG TABLET    Take 10 mg by mouth daily.   MULTIPLE VITAMINS-MINERALS (MULTIVITAMIN PO)    Take by mouth daily.  Modified Medications   No medications on file  Discontinued Medications   PREDNISONE (DELTASONE) 10 MG TABLET    All po: 40mg  daily X 4 days, then 20mg  daily X 4 days, then 10mg  daily X 4 days, then 5mg  daily X 4 days     Physical Exam: Physical Exam  Constitutional: He appears well-developed and well-nourished. No distress.  HENT:  Head: Normocephalic and atraumatic.  Right Ear: External ear normal.  Left Ear: External ear normal.  Nose: Nose normal.  Mouth/Throat: Oropharynx is clear and moist. No oropharyngeal exudate.  Eyes: Conjunctivae and EOM are normal. Pupils are equal, round, and reactive to light. Right eye exhibits no discharge. Left eye exhibits no discharge. No scleral icterus.  Neck: Normal range of motion. Neck supple. No JVD present. No tracheal deviation present. No thyromegaly present.  Cardiovascular: Normal rate, regular rhythm and intact distal pulses.  Exam reveals no gallop and no friction rub.     Murmur heard. 2-3 systolic ejection murmur  Pulmonary/Chest: Effort normal and breath sounds normal. No stridor. No respiratory distress. He has no wheezes. He has no rales. He exhibits no tenderness.  Abdominal: Soft. Bowel sounds are normal. He exhibits no distension. There is no tenderness. There is no rebound and no guarding.  Musculoskeletal: Normal range of motion. He exhibits no edema and no tenderness.  Lymphadenopathy:    He has no cervical adenopathy.  Neurological: He is alert. He has normal reflexes. No cranial nerve deficit. He exhibits normal muscle tone. Coordination normal.  Skin: Skin is warm and dry. No rash noted. He is not diaphoretic. No erythema. No pallor.  Psychiatric: He has a normal mood and affect. His behavior is normal. Thought content normal. His mood appears not anxious. His affect  is not angry, not blunt, not labile and not inappropriate. His speech is not delayed, not tangential and not slurred. He is not agitated, not aggressive, not hyperactive, not slowed, not withdrawn, not actively hallucinating and not combative. Thought content is not paranoid and not delusional. Cognition and memory are impaired. He expresses inappropriate judgment. He does not express impulsivity. He does not exhibit a depressed mood. He expresses no homicidal and no suicidal ideation. He expresses no suicidal plans and no homicidal plans. He is communicative. He exhibits abnormal recent memory. He is attentive.    Filed Vitals:   11/10/13 1515  BP: 79/60  Pulse: 80  Temp: 99.6 F (37.6 C)  TempSrc: Tympanic  Resp: 20      Labs reviewed: Basic Metabolic Panel:  Recent Labs  09/14/13 1503  10/17/13 1045 10/18/13 0506 10/19/13 1130 10/26/13  NA 142  < > 139 140 138  --   K 4.1  < > 4.7 4.4 5.0  --   CL 107  < > 103 104 107  --   CO2 28  --  22 24 18*  --   GLUCOSE 113*  < > 145* 82 130*  --   BUN 18  < > 25* 17 13  --   CREATININE 0.8  < > 0.85 0.75 0.75  --   CALCIUM  9.5  --  8.8 8.2* 8.0*  --   TSH 0.98  --   --   --   --  0.34*  < > = values in this interval not displayed. Liver Function Tests:CBC:  Recent Labs  02/23/13 1426  10/14/13 1133  10/17/13 1045  10/19/13 1130 10/20/13 1024 10/20/13 1204 10/21/13 0500 10/26/13  WBC 4.6  < > 9.9  --  10.7*  < > 6.4 5.3  --  4.6 8.9  NEUTROABS 2.3  --  2.9  --  4.2  --   --   --   --   --   --   HGB 13.1  < > 13.6  < > 13.2  < > 13.2 12.9*  --  12.7* 11.8*  HCT 39.6  < > 40.5  < > 37.9*  < > 39.3 37.5*  --  36.1* 33*  MCV 93.0  < > 88.6  --  85.4  < > 88.9 86.6  --  84.5  --   PLT 71.0 Repeated and verified X2.*  --  134*  --  82*  < > PLATELET CLUMPS NOTED ON SMEAR, COUNT APPEARS DECREASED 51* 53* 44* 77*  < > = values in this interval not displayed.  Past Procedures:  10/18/13 CXR  IMPRESSION:  Bibasilar pulmonary airspace disease, without significant change.  COPD.   Assessment/Plan Adrenal insufficiency, presumed Staff reported Bp 79/60, he is asymptomatic, will obtain Cath UA C/S, CBC, CMP  CAP (community acquired pneumonia) Repeat CXRAs. Last hospitalization:  influenza positive,  had URI symptom with worsening with productive cough, decreased appetite and malaise per wife.             Recently completed levaquin as outpatient on 1/20. CXR concerning for probable left lower lobe pneumonia. Repeat CXR with bibasilar disease.               treated with tamiflu for 5 days - stopped date 1/29     ABX: vanc & zosyn (1/24>>1/28); Doxy started (1/28); Needs total of 14 days     Treated with Duonebs and IS, Initially had O2 requirement  but was weaned off for >48 hours prior to d/c     Stress dose steroids started 1/28: Solu-Cortef 50mg  IV q6h; transition to Prednisone 50mg  qday; needs prolonged taper given recent use and marked improvement in overall function after starting      Anemia, unspecified Hgb 11.8 10/26/13  Thrombocytopenia, unspecified Down to 40 while in hospital-77 at  SNF    Dementia  at baseline per wife PMH is significant for Alzheimer  Continue Aricept & Namenda   Safety concerns regarding return to [place] given deconditioning from acute illness; PT/OT to eval recommended SNF placement. Hopeful will return to [facility] SN[facility] but significant safety and supervision concerns given level of dementia and need for 24 hour supervision.   will obtain TSH    Family/ Staff Communication: observe the patient.   Goals of Care: SNF  Labs/tests ordered: CBC, CMP, CXR, Cath UA C/S

## 2013-11-11 LAB — CBC AND DIFFERENTIAL
HCT: 35 % — AB (ref 41–53)
Hemoglobin: 12.4 g/dL — AB (ref 13.5–17.5)
Platelets: 164 10*3/uL (ref 150–399)
WBC: 23.3 10*3/mL

## 2013-11-11 LAB — BASIC METABOLIC PANEL
BUN: 17 mg/dL (ref 4–21)
Creatinine: 0.7 mg/dL (ref 0.6–1.3)
Glucose: 103 mg/dL
Potassium: 4.3 mmol/L (ref 3.4–5.3)
Sodium: 139 mmol/L (ref 137–147)

## 2013-11-11 LAB — HEPATIC FUNCTION PANEL
ALK PHOS: 113 U/L (ref 25–125)
ALT: 14 U/L (ref 10–40)
AST: 30 U/L (ref 14–40)
Bilirubin, Total: 1.6 mg/dL

## 2013-11-12 NOTE — Assessment & Plan Note (Addendum)
Repeat CXRAs. Last hospitalization:  influenza positive,  had URI symptom with worsening with productive cough, decreased appetite and malaise per wife.             Recently completed levaquin as outpatient on 1/20. CXR concerning for probable left lower lobe pneumonia. Repeat CXR with bibasilar disease.               treated with tamiflu for 5 days - stopped date 1/29     ABX: vanc & zosyn (1/24>>1/28); Doxy started (1/28); Needs total of 14 days     Treated with Duonebs and IS, Initially had O2 requirement but was weaned off for >48 hours prior to d/c     Stress dose steroids started 1/28: Solu-Cortef 50mg  IV q6h; transition to Prednisone 50mg  qday; needs prolonged taper given recent use and marked improvement in overall function after starting

## 2013-11-12 NOTE — Assessment & Plan Note (Signed)
Hgb 11.8 10/26/13

## 2013-11-12 NOTE — Assessment & Plan Note (Signed)
at baseline per wife PMH is significant for Alzheimer  Continue Aricept & Namenda   Safety concerns regarding return to [place] given deconditioning from acute illness; PT/OT to eval recommended SNF placement. [facility] will return to [facility] but significant safety and supervision concerns given level of dementia and need for 24 hour supervision.   will obtain TSH

## 2013-11-12 NOTE — Assessment & Plan Note (Signed)
Staff reported Bp 79/60, he is asymptomatic, will obtain Cath UA C/S, CBC, CMP

## 2013-11-12 NOTE — Assessment & Plan Note (Signed)
Down to 62 while in hospital-77 at [facility]

## 2013-11-13 ENCOUNTER — Non-Acute Institutional Stay (SKILLED_NURSING_FACILITY): Payer: Medicare Other | Admitting: Nurse Practitioner

## 2013-11-13 ENCOUNTER — Encounter: Payer: Self-pay | Admitting: Nurse Practitioner

## 2013-11-13 DIAGNOSIS — L8990 Pressure ulcer of unspecified site, unspecified stage: Secondary | ICD-10-CM

## 2013-11-13 DIAGNOSIS — L8992 Pressure ulcer of unspecified site, stage 2: Secondary | ICD-10-CM

## 2013-11-13 DIAGNOSIS — D696 Thrombocytopenia, unspecified: Secondary | ICD-10-CM

## 2013-11-13 DIAGNOSIS — D649 Anemia, unspecified: Secondary | ICD-10-CM

## 2013-11-13 DIAGNOSIS — J189 Pneumonia, unspecified organism: Secondary | ICD-10-CM

## 2013-11-13 DIAGNOSIS — R630 Anorexia: Secondary | ICD-10-CM

## 2013-11-13 DIAGNOSIS — F0390 Unspecified dementia without behavioral disturbance: Secondary | ICD-10-CM

## 2013-11-13 NOTE — Assessment & Plan Note (Signed)
Repeat CXR 11/10/13 showed patchy interstitial pneumonitis, elevated wbc 23.3 11/11/13, Levaquin 500mg  x1 started 11/10/13 then 250mg  daily for total 10 days-tolerated. Last hospitalization:  influenza positive,  had URI symptom with worsening with productive cough, decreased appetite and malaise per wife.             Recently completed levaquin as outpatient on 1/20. CXR concerning for probable left lower lobe pneumonia. Repeat CXR with bibasilar disease.               treated with tamiflu for 5 days - stopped date 1/29     ABX: vanc & zosyn (1/24>>1/28); Doxy started (1/28); Needs total of 14 days     Treated with Duonebs and IS, Initially had O2 requirement but was weaned off for >48 hours prior to d/c     Stress dose steroids started 1/28: Solu-Cortef 50mg  IV q6h; transition to Prednisone 50mg  qday; needs prolonged taper given recent use and marked improvement in overall function after starting

## 2013-11-13 NOTE — Assessment & Plan Note (Signed)
Reported poor, will try Mirtazapine 7.5mg  nightly, monitor weight weekly.

## 2013-11-13 NOTE — Progress Notes (Signed)
Patient ID: Vincent Lee, male   DOB: January 10, 1921, 80 y.o.   MRN: [id number]   Code Status: DNR  No Known Allergies  Chief Complaint  Patient presents with  . Medical Managment of Chronic Issues    PNA, excoriated buttocks skin, reported poor appetite  . Acute Visit    HPI: Patient is a 80 y.o. male seen in the SNF at [facility] today for evaluation of  Reported poor appetite,  and other chronic medical conditions. Hospitalized from 10/17/2013 to 10/22/13 for Malaise & Productive Cough -Influenza with pneumonia-improved upon discharge.   Problem List Items Addressed This Visit   Thrombocytopenia, unspecified     Down to 107 while in hospital-77 at SNF-continue to improve-164 11/11/13.       Pressure ulcer stage II     Superficial, R+L buttocks, apply Lidocaine jel for pain and taking Tylenol for comfort purpose.     Dementia (Chronic)     at baseline per wife PMH is significant for Alzheimer  Continue Aricept & Namenda   Safety concerns:SNF placement.hgith risk for falling, last fall this week, the patient was found sitting upright on bedroom floor without apparent injury, this is related to his lack of safety awareness and increased frailty     Relevant Medications      mirtazapine (REMERON) 7.5 MG tablet   CAP (community acquired pneumonia)     Repeat CXR 11/10/13 showed patchy interstitial pneumonitis, elevated wbc 23.3 11/11/13, Levaquin 500mg  x1 started 11/10/13 then 250mg  daily for total 10 days-tolerated. Last hospitalization:  influenza positive,  had URI symptom with worsening with productive cough, decreased appetite and malaise per wife.             Recently completed levaquin as outpatient on 1/20. CXR concerning for probable left lower lobe pneumonia. Repeat CXR with bibasilar disease.               treated with tamiflu for 5 days - stopped date 1/29     ABX: vanc & zosyn (1/24>>1/28); Doxy started (1/28); Needs total of 14 days     Treated with Duonebs and IS,  Initially had O2 requirement but was weaned off for >48 hours prior to d/c     Stress dose steroids started 1/28: Solu-Cortef 50mg  IV q6h; transition to Prednisone 50mg  qday; needs prolonged taper given recent use and marked improvement in overall function after starting         Appetite loss - Primary     Reported poor, will try Mirtazapine 7.5mg  nightly, monitor weight weekly.     Anemia, unspecified     Last Hgb 12.4 11/11/13       Review of Systems:  Review of Systems  Constitutional: Positive for malaise/fatigue. Negative for fever, chills, weight loss and diaphoresis.       Appetite is reportedly poor.   HENT: Positive for hearing loss. Negative for congestion, ear discharge, ear pain, nosebleeds, sore throat and tinnitus.   Eyes: Negative for blurred vision, double vision, photophobia, pain, discharge and redness.  Respiratory: Positive for cough. Negative for hemoptysis, sputum production, shortness of breath, wheezing and stridor.   Cardiovascular: Negative for chest pain, palpitations, orthopnea, claudication, leg swelling and PND.  Gastrointestinal: Negative for heartburn, nausea, vomiting, abdominal pain, diarrhea, constipation, blood in stool and melena.  Genitourinary: Negative for dysuria, urgency, frequency, hematuria and flank pain.  Musculoskeletal: Negative for back pain, falls, joint pain, myalgias and neck pain.  Skin: Negative for itching and rash.  Superficial skin breakdown R+L buttocks.   Neurological: Negative for dizziness, tingling, tremors, sensory change, speech change, focal weakness, seizures, loss of consciousness, weakness and headaches.  Endo/Heme/Allergies: Negative for environmental allergies and polydipsia. Does not bruise/bleed easily.  Psychiatric/Behavioral: Positive for memory loss. Negative for depression, suicidal ideas, hallucinations and substance abuse. The patient is not nervous/anxious and does not have insomnia.      Past  Medical History  Diagnosis Date  . Alzheimer disease   . Hyperlipidemia   . H/O prostate cancer   . Cataract   . Cancer of skin     HAD FOR YEARS, REMOVED FOR YEARS   Past Surgical History  Procedure Laterality Date  . High dose radiation implant insertion      for prostate ca  . Prostate surgery     Social History:   reports that he has never smoked. He has never used smokeless tobacco. He reports that he does not drink alcohol or use illicit drugs.  Family History  Problem Relation Age of Onset  . Cancer Brother      ? lung  . Diabetes Brother     not definite  . Heart disease Neg Hx   . Stroke Neg Hx   . Dementia Mother   . Diverticulitis Father     Medications: Patient's Medications  New Prescriptions   No medications on file  Previous Medications   ACETAMINOPHEN (TYLENOL) 325 MG TABLET    Take 2 tablets (650 mg total) by mouth every 6 (six) hours as needed for mild pain (or Fever >/= 101).   ALBUTEROL (PROVENTIL) (2.5 MG/3ML) 0.083% NEBULIZER SOLUTION    Take 3 mLs (2.5 mg total) by nebulization every 6 (six) hours as needed for wheezing or shortness of breath.   DONEPEZIL (ARICEPT) 5 MG TABLET    Take 5 mg by mouth at bedtime.   GUAIFENESIN (MUCINEX) 600 MG 12 HR TABLET    Take 1 tablet (600 mg total) by mouth 2 (two) times daily.   MEMANTINE (NAMENDA) 10 MG TABLET    Take 10 mg by mouth daily.   MIRTAZAPINE (REMERON) 7.5 MG TABLET    Take 7.5 mg by mouth at bedtime.   MULTIPLE VITAMINS-MINERALS (MULTIVITAMIN PO)    Take by mouth daily.  Modified Medications   No medications on file  Discontinued Medications   No medications on file     Physical Exam: Physical Exam  Constitutional: He appears well-developed and well-nourished. No distress.  HENT:  Head: Normocephalic and atraumatic.  Right Ear: External ear normal.  Left Ear: External ear normal.  Nose: Nose normal.  Mouth/Throat: Oropharynx is clear and moist. No oropharyngeal exudate.  Eyes:  Conjunctivae and EOM are normal. Pupils are equal, round, and reactive to light. Right eye exhibits no discharge. Left eye exhibits no discharge. No scleral icterus.  Neck: Normal range of motion. Neck supple. No JVD present. No tracheal deviation present. No thyromegaly present.  Cardiovascular: Normal rate, regular rhythm and intact distal pulses.  Exam reveals no gallop and no friction rub.   Murmur heard. 2-3 systolic ejection murmur  Pulmonary/Chest: Effort normal and breath sounds normal. No stridor. No respiratory distress. He has no wheezes. He has no rales. He exhibits no tenderness.  Abdominal: Soft. Bowel sounds are normal. He exhibits no distension. There is no tenderness. There is no rebound and no guarding.  Musculoskeletal: Normal range of motion. He exhibits no edema and no tenderness.  Ambulates with walker  Lymphadenopathy:  He has no cervical adenopathy.  Neurological: He is alert. He has normal reflexes. No cranial nerve deficit. He exhibits normal muscle tone. Coordination normal.  Skin: Skin is warm and dry. No rash noted. He is not diaphoretic. No erythema. No pallor.  Superficial skin breakdown R+L buttocks.   Psychiatric: He has a normal mood and affect. His behavior is normal. Thought content normal. His mood appears not anxious. His affect is not angry, not blunt, not labile and not inappropriate. His speech is not delayed, not tangential and not slurred. He is not agitated, not aggressive, not hyperactive, not slowed, not withdrawn, not actively hallucinating and not combative. Thought content is not paranoid and not delusional. Cognition and memory are impaired. He expresses inappropriate judgment. He does not express impulsivity. He does not exhibit a depressed mood. He expresses no homicidal and no suicidal ideation. He expresses no suicidal plans and no homicidal plans. He is communicative. He exhibits abnormal recent memory. He is attentive.    Filed Vitals:    11/13/13 1310  BP: 110/58  Pulse: 92  Temp: 97.6 F (36.4 C)  TempSrc: Tympanic  Resp: 18      Labs reviewed: Basic Metabolic Panel:  Recent Labs  10/17/13 1045 10/18/13 0506 10/19/13 1130 10/26/13 11/11/13  NA 139 140 138  --  139  K 4.7 4.4 5.0  --  4.3  CL 103 104 107  --   --   CO2 22 24 18*  --   --   GLUCOSE 145* 82 130*  --   --   BUN 25* 17 13  --  17  CREATININE 0.85 0.75 0.75  --  0.7  CALCIUM 8.8 8.2* 8.0*  --   --   TSH  --   --   --  0.34*  --    Liver Function Tests:CBC:  Recent Labs  02/23/13 1426  10/14/13 1133  10/17/13 1045  10/19/13 1130 10/20/13 1024  10/21/13 0500 10/26/13 11/11/13  WBC 4.6  < > 9.9  --  10.7*  < > 6.4 5.3  --  4.6 8.9 23.3  NEUTROABS 2.3  --  2.9  --  4.2  --   --   --   --   --   --   --   HGB 13.1  < > 13.6  < > 13.2  < > 13.2 12.9*  --  12.7* 11.8* 12.4*  HCT 39.6  < > 40.5  < > 37.9*  < > 39.3 37.5*  --  36.1* 33* 35*  MCV 93.0  < > 88.6  --  85.4  < > 88.9 86.6  --  84.5  --   --   PLT 71.0 Repeated and verified X2.*  --  134*  --  82*  < > PLATELET CLUMPS NOTED ON SMEAR, COUNT APPEARS DECREASED 51*  < > 44* 77* 164  < > = values in this interval not displayed.  Past Procedures:  10/18/13 CXR  IMPRESSION:  Bibasilar pulmonary airspace disease, without significant change.  COPD.  11/10/13 CXR no cardiomegaly, pulmonary vascular congestion, or pleural effusion, old granulomatous disease, patchy bibasilar atelectasis or interstitial pneumonitis.  Assessment/Plan CAP (community acquired pneumonia) Repeat CXR 11/10/13 showed patchy interstitial pneumonitis, elevated wbc 23.3 11/11/13, Levaquin 500mg  x1 started 11/10/13 then 250mg  daily for total 10 days-tolerated. Last hospitalization:  influenza positive,  had URI symptom with worsening with productive cough, decreased appetite and malaise per wife.  Recently completed levaquin as outpatient on 1/20. CXR concerning for probable left lower lobe pneumonia. Repeat  CXR with bibasilar disease.               treated with tamiflu for 5 days - stopped date 1/29     ABX: vanc & zosyn (1/24>>1/28); Doxy started (1/28); Needs total of 14 days     Treated with Duonebs and IS, Initially had O2 requirement but was weaned off for >48 hours prior to d/c     Stress dose steroids started 1/28: Solu-Cortef 50mg  IV q6h; transition to Prednisone 50mg  qday; needs prolonged taper given recent use and marked improvement in overall function after starting       Thrombocytopenia, unspecified Down to 35 while in hospital-77 at SNF-continue to improve-164 11/11/13.     Dementia at baseline per wife PMH is significant for Alzheimer  Continue Aricept & Namenda   Safety concerns:SNF placement.hgith risk for falling, last fall this week, the patient was found sitting upright on bedroom floor without apparent injury, this is related to his lack of safety awareness and increased frailty   Anemia, unspecified Last Hgb 12.4 11/11/13  Appetite loss Reported poor, will try Mirtazapine 7.5mg  nightly, monitor weight weekly.   Pressure ulcer stage II Superficial, R+L buttocks, apply Lidocaine jel for pain and taking Tylenol for comfort purpose.     Family/ Staff Communication: observe the patient.   Goals of Care: SNF  Labs/tests ordered: none

## 2013-11-13 NOTE — Assessment & Plan Note (Signed)
Superficial, R+L buttocks, apply Lidocaine jel for pain and taking Tylenol for comfort purpose.

## 2013-11-13 NOTE — Assessment & Plan Note (Signed)
Down to 68 while in hospital-77 at SNF-continue to improve-164 11/11/13.

## 2013-11-13 NOTE — Assessment & Plan Note (Signed)
Last Hgb 12.4 11/11/13

## 2013-11-13 NOTE — Assessment & Plan Note (Signed)
at baseline per wife PMH is significant for Alzheimer  Continue Aricept & Namenda   Safety concerns:SNF placement.hgith risk for falling, last fall this week, the patient was found sitting upright on bedroom floor without apparent injury, this is related to his lack of safety awareness and increased frailty

## 2013-11-20 ENCOUNTER — Non-Acute Institutional Stay (SKILLED_NURSING_FACILITY): Payer: Medicare Other | Admitting: Nurse Practitioner

## 2013-11-20 ENCOUNTER — Encounter: Payer: Self-pay | Admitting: Nurse Practitioner

## 2013-11-20 DIAGNOSIS — J189 Pneumonia, unspecified organism: Secondary | ICD-10-CM

## 2013-11-20 DIAGNOSIS — L8992 Pressure ulcer of unspecified site, stage 2: Secondary | ICD-10-CM

## 2013-11-20 DIAGNOSIS — E2740 Unspecified adrenocortical insufficiency: Secondary | ICD-10-CM

## 2013-11-20 DIAGNOSIS — R630 Anorexia: Secondary | ICD-10-CM

## 2013-11-20 DIAGNOSIS — E2749 Other adrenocortical insufficiency: Secondary | ICD-10-CM

## 2013-11-20 DIAGNOSIS — F0390 Unspecified dementia without behavioral disturbance: Secondary | ICD-10-CM

## 2013-11-20 DIAGNOSIS — D649 Anemia, unspecified: Secondary | ICD-10-CM

## 2013-11-20 DIAGNOSIS — R1310 Dysphagia, unspecified: Secondary | ICD-10-CM | POA: Insufficient documentation

## 2013-11-20 DIAGNOSIS — L8990 Pressure ulcer of unspecified site, unspecified stage: Secondary | ICD-10-CM

## 2013-11-20 NOTE — Assessment & Plan Note (Signed)
Last Hgb 12.4 11/11/13 

## 2013-11-20 NOTE — Assessment & Plan Note (Signed)
at baseline per wife PMH is significant for Alzheimer  Continue Aricept & Namenda   Safety concerns:SNF placement.hgith risk for falling, last fall this week, the patient was found sitting upright on bedroom floor without apparent injury, this is related to his lack of safety awareness and increased frailty  Hospice consult.

## 2013-11-20 NOTE — Assessment & Plan Note (Signed)
2-3 small stage II pressure ulcer on R+L buttocks, apply Lidocaine jel for pain and taking Tylenol for comfort purpose. Also Mycology II cream bid to affected areas. Monitor the patient.

## 2013-11-20 NOTE — Progress Notes (Signed)
Patient ID: Vincent Lee, male   DOB: Nov 20, 1920, 80 y.o.   MRN: [id number]   Code Status: DNR  No Known Allergies  Chief Complaint  Patient presents with  . Medical Managment of Chronic Issues    pressure ulcer R+L buttocks, dysphagia.   . Acute Visit    HPI: Patient is a 80 y.o. male seen in the SNF at [facility] today for evaluation of  PNA, R+L buttock pressure ulcers, dysphagia, weights,  and other chronic medical conditions. Hospitalized from 10/17/2013 to 10/22/13 for Malaise & Productive Cough -Influenza with pneumonia-improved upon discharge.   Problem List Items Addressed This Visit   Pressure ulcer stage II     2-3 small stage II pressure ulcer on R+L buttocks, apply Lidocaine jel for pain and taking Tylenol for comfort purpose. Also Mycology II cream bid to affected areas. Monitor the patient.      Dysphagia, unspecified(787.20)     Mechanical soft food and honey thick liquid. Observe the patient.     Dementia (Chronic)     at baseline per wife PMH is significant for Alzheimer  Continue Aricept & Namenda   Safety concerns:SNF placement.hgith risk for falling, last fall this week, the patient was found sitting upright on bedroom floor without apparent injury, this is related to his lack of safety awareness and increased frailty  Hospice consult.     CAP (community acquired pneumonia) - Primary     Fully treated. Repeated CXR 11/10/13 showed patchy interstitial pneumonitis, elevated wbc 23.3 11/11/13, Levaquin 500mg  x1 started 11/10/13 then 250mg  daily for total 10 days-tolerated. Last hospitalization:  influenza positive,  had URI symptom with worsening with productive cough, decreased appetite and malaise per wife.              Recently completed levaquin as outpatient on 1/20. CXR concerning for probable left lower lobe pneumonia. Repeat CXR with bibasilar disease.               treated with tamiflu for 5 days - stopped date 1/29     ABX: vanc & zosyn  (1/24>>1/28); Doxy started (1/28); Needs total of 14 days     Treated with Duonebs and IS, Initially had O2 requirement but was weaned off for >48 hours prior to d/c     Stress dose steroids started 1/28: Solu-Cortef 50mg  IV q6h; transition to Prednisone 50mg  qday; needs prolonged taper given recent use and marked improvement in overall function after starting      Appetite loss     Better with  Mirtazapine 7.5mg  nightly, monitor weight weekly.-weight loss #2 Ibs from #114 11/09/13 to #112 11/15/13     Anemia, unspecified     Last Hgb 12.4 11/11/13     Adrenal insufficiency, presumed     Presumed clinically. No further hypotension episodes.        Review of Systems:  Review of Systems  Constitutional: Positive for malaise/fatigue. Negative for fever, chills, weight loss and diaphoresis.       Appetite is reportedly poor.   HENT: Positive for hearing loss. Negative for congestion, ear discharge, ear pain, nosebleeds, sore throat and tinnitus.   Eyes: Negative for blurred vision, double vision, photophobia, pain, discharge and redness.  Respiratory: Positive for cough. Negative for hemoptysis, sputum production, shortness of breath, wheezing and stridor.   Cardiovascular: Negative for chest pain, palpitations, orthopnea, claudication, leg swelling and PND.  Gastrointestinal: Negative for heartburn, nausea, vomiting, abdominal pain, diarrhea, constipation, blood in stool and  melena.       Dysphagia-Honey thick and mechanical soft.   Genitourinary: Negative for dysuria, urgency, frequency, hematuria and flank pain.  Musculoskeletal: Negative for back pain, falls, joint pain, myalgias and neck pain.  Skin: Negative for itching and rash.       Marland Kitchen 2-3 small pressure ulcers stage on the  R+L buttocks.   Neurological: Negative for dizziness, tingling, tremors, sensory change, speech change, focal weakness, seizures, loss of consciousness, weakness and headaches.  Endo/Heme/Allergies: Negative  for environmental allergies and polydipsia. Does not bruise/bleed easily.  Psychiatric/Behavioral: Positive for memory loss. Negative for depression, suicidal ideas, hallucinations and substance abuse. The patient is not nervous/anxious and does not have insomnia.      Past Medical History  Diagnosis Date  . Alzheimer disease   . Hyperlipidemia   . H/O prostate cancer   . Cataract   . Cancer of skin     HAD FOR YEARS, REMOVED FOR YEARS   Past Surgical History  Procedure Laterality Date  . High dose radiation implant insertion      for prostate ca  . Prostate surgery     Social History:   reports that he has never smoked. He has never used smokeless tobacco. He reports that he does not drink alcohol or use illicit drugs.  Family History  Problem Relation Age of Onset  . Cancer Brother      ? lung  . Diabetes Brother     not definite  . Heart disease Neg Hx   . Stroke Neg Hx   . Dementia Mother   . Diverticulitis Father     Medications: Patient's Medications  New Prescriptions   No medications on file  Previous Medications   ACETAMINOPHEN (TYLENOL) 325 MG TABLET    Take 2 tablets (650 mg total) by mouth every 6 (six) hours as needed for mild pain (or Fever >/= 101).   ALBUTEROL (PROVENTIL) (2.5 MG/3ML) 0.083% NEBULIZER SOLUTION    Take 3 mLs (2.5 mg total) by nebulization every 6 (six) hours as needed for wheezing or shortness of breath.   DONEPEZIL (ARICEPT) 5 MG TABLET    Take 5 mg by mouth at bedtime.   GUAIFENESIN (MUCINEX) 600 MG 12 HR TABLET    Take 1 tablet (600 mg total) by mouth 2 (two) times daily.   MEMANTINE (NAMENDA) 10 MG TABLET    Take 10 mg by mouth daily.   MIRTAZAPINE (REMERON) 7.5 MG TABLET    Take 7.5 mg by mouth at bedtime.   MULTIPLE VITAMINS-MINERALS (MULTIVITAMIN PO)    Take by mouth daily.  Modified Medications   No medications on file  Discontinued Medications   No medications on file     Physical Exam: Physical Exam  Constitutional: He  appears well-developed and well-nourished. No distress.  HENT:  Head: Normocephalic and atraumatic.  Right Ear: External ear normal.  Left Ear: External ear normal.  Nose: Nose normal.  Mouth/Throat: Oropharynx is clear and moist. No oropharyngeal exudate.  Eyes: Conjunctivae and EOM are normal. Pupils are equal, round, and reactive to light. Right eye exhibits no discharge. Left eye exhibits no discharge. No scleral icterus.  Neck: Normal range of motion. Neck supple. No JVD present. No tracheal deviation present. No thyromegaly present.  Cardiovascular: Normal rate, regular rhythm and intact distal pulses.  Exam reveals no gallop and no friction rub.   Murmur heard. 2-3 systolic ejection murmur  Pulmonary/Chest: Effort normal and breath sounds normal. No stridor. No respiratory  distress. He has no wheezes. He has no rales. He exhibits no tenderness.  Abdominal: Soft. Bowel sounds are normal. He exhibits no distension. There is no tenderness. There is no rebound and no guarding.  Musculoskeletal: Normal range of motion. He exhibits no edema and no tenderness.  Ambulates with walker  Lymphadenopathy:    He has no cervical adenopathy.  Neurological: He is alert. He has normal reflexes. No cranial nerve deficit. He exhibits normal muscle tone. Coordination normal.  Skin: Skin is warm and dry. No rash noted. He is not diaphoretic. No erythema. No pallor.  2-3 small pressure ulcers stage on the  R+L buttocks.   Psychiatric: He has a normal mood and affect. His behavior is normal. Thought content normal. His mood appears not anxious. His affect is not angry, not blunt, not labile and not inappropriate. His speech is not delayed, not tangential and not slurred. He is not agitated, not aggressive, not hyperactive, not slowed, not withdrawn, not actively hallucinating and not combative. Thought content is not paranoid and not delusional. Cognition and memory are impaired. He expresses inappropriate  judgment. He does not express impulsivity. He does not exhibit a depressed mood. He expresses no homicidal and no suicidal ideation. He expresses no suicidal plans and no homicidal plans. He is communicative. He exhibits abnormal recent memory. He is attentive.    Filed Vitals:   11/20/13 1429  BP: 120/70  Pulse: 96  Temp: 99 F (37.2 C)  TempSrc: Tympanic  Resp: 16      Labs reviewed: Basic Metabolic Panel:  Recent Labs  10/17/13 1045 10/18/13 0506 10/19/13 1130 10/26/13 11/11/13  NA 139 140 138  --  139  K 4.7 4.4 5.0  --  4.3  CL 103 104 107  --   --   CO2 22 24 18*  --   --   GLUCOSE 145* 82 130*  --   --   BUN 25* 17 13  --  17  CREATININE 0.85 0.75 0.75  --  0.7  CALCIUM 8.8 8.2* 8.0*  --   --   TSH  --   --   --  0.34*  --    Liver Function Tests:CBC:  Recent Labs  02/23/13 1426  10/14/13 1133  10/17/13 1045  10/19/13 1130 10/20/13 1024  10/21/13 0500 10/26/13 11/11/13  WBC 4.6  < > 9.9  --  10.7*  < > 6.4 5.3  --  4.6 8.9 23.3  NEUTROABS 2.3  --  2.9  --  4.2  --   --   --   --   --   --   --   HGB 13.1  < > 13.6  < > 13.2  < > 13.2 12.9*  --  12.7* 11.8* 12.4*  HCT 39.6  < > 40.5  < > 37.9*  < > 39.3 37.5*  --  36.1* 33* 35*  MCV 93.0  < > 88.6  --  85.4  < > 88.9 86.6  --  84.5  --   --   PLT 71.0 Repeated and verified X2.*  --  134*  --  82*  < > PLATELET CLUMPS NOTED ON SMEAR, COUNT APPEARS DECREASED 51*  < > 44* 77* 164  < > = values in this interval not displayed.  Past Procedures:  10/18/13 CXR  IMPRESSION:  Bibasilar pulmonary airspace disease, without significant change.  COPD.  11/10/13 CXR no cardiomegaly, pulmonary vascular congestion, or pleural effusion, old granulomatous  disease, patchy bibasilar atelectasis or interstitial pneumonitis.   Assessment/Plan CAP (community acquired pneumonia) Fully treated. Repeated CXR 11/10/13 showed patchy interstitial pneumonitis, elevated wbc 23.3 11/11/13, Levaquin 500mg  x1 started 11/10/13 then 250mg   daily for total 10 days-tolerated. Last hospitalization:  influenza positive,  had URI symptom with worsening with productive cough, decreased appetite and malaise per wife.              Recently completed levaquin as outpatient on 1/20. CXR concerning for probable left lower lobe pneumonia. Repeat CXR with bibasilar disease.               treated with tamiflu for 5 days - stopped date 1/29     ABX: vanc & zosyn (1/24>>1/28); Doxy started (1/28); Needs total of 14 days     Treated with Duonebs and IS, Initially had O2 requirement but was weaned off for >48 hours prior to d/c     Stress dose steroids started 1/28: Solu-Cortef 50mg  IV q6h; transition to Prednisone 50mg  qday; needs prolonged taper given recent use and marked improvement in overall function after starting    Adrenal insufficiency, presumed Presumed clinically. No further hypotension episodes.   Appetite loss Better with  Mirtazapine 7.5mg  nightly, monitor weight weekly.-weight loss #2 Ibs from #114 11/09/13 to #112 11/15/13   Anemia, unspecified Last Hgb 12.4 11/11/13   Dementia at baseline per wife PMH is significant for Alzheimer  Continue Aricept & Namenda   Safety concerns:SNF placement.hgith risk for falling, last fall this week, the patient was found sitting upright on bedroom floor without apparent injury, this is related to his lack of safety awareness and increased frailty  Hospice consult.   Pressure ulcer stage II 2-3 small stage II pressure ulcer on R+L buttocks, apply Lidocaine jel for pain and taking Tylenol for comfort purpose. Also Mycology II cream bid to affected areas. Monitor the patient.    Dysphagia, unspecified(787.20) Mechanical soft food and honey thick liquid. Observe the patient.     Family/ Staff Communication: observe the patient. Hospice referral.   Goals of Care: SNF  Labs/tests ordered: none

## 2013-11-20 NOTE — Assessment & Plan Note (Signed)
Presumed clinically. No further hypotension episodes.

## 2013-11-20 NOTE — Assessment & Plan Note (Signed)
Better with  Mirtazapine 7.5mg  nightly, monitor weight weekly.-weight loss #2 Ibs from #114 11/09/13 to #112 11/15/13

## 2013-11-20 NOTE — Assessment & Plan Note (Signed)
Mechanical soft food and honey thick liquid. Observe the patient.

## 2013-11-20 NOTE — Assessment & Plan Note (Signed)
Fully treated. Repeated CXR 11/10/13 showed patchy interstitial pneumonitis, elevated wbc 23.3 11/11/13, Levaquin 500mg  x1 started 11/10/13 then 250mg  daily for total 10 days-tolerated. Last hospitalization:  influenza positive,  had URI symptom with worsening with productive cough, decreased appetite and malaise per wife.              Recently completed levaquin as outpatient on 1/20. CXR concerning for probable left lower lobe pneumonia. Repeat CXR with bibasilar disease.               treated with tamiflu for 5 days - stopped date 1/29     ABX: vanc & zosyn (1/24>>1/28); Doxy started (1/28); Needs total of 14 days     Treated with Duonebs and IS, Initially had O2 requirement but was weaned off for >48 hours prior to d/c     Stress dose steroids started 1/28: Solu-Cortef 50mg  IV q6h; transition to Prednisone 50mg  qday; needs prolonged taper given recent use and marked improvement in overall function after starting

## 2013-11-21 ENCOUNTER — Inpatient Hospital Stay (HOSPITAL_COMMUNITY): Payer: Medicare Other

## 2013-11-21 ENCOUNTER — Emergency Department (HOSPITAL_COMMUNITY): Payer: Medicare Other

## 2013-11-21 ENCOUNTER — Inpatient Hospital Stay (HOSPITAL_COMMUNITY)
Admission: EM | Admit: 2013-11-21 | Discharge: 2013-12-23 | DRG: 871 | Disposition: E | Payer: Medicare Other | Attending: Family Medicine | Admitting: Family Medicine

## 2013-11-21 ENCOUNTER — Encounter (HOSPITAL_COMMUNITY): Payer: Self-pay | Admitting: Emergency Medicine

## 2013-11-21 DIAGNOSIS — R10819 Abdominal tenderness, unspecified site: Secondary | ICD-10-CM | POA: Diagnosis present

## 2013-11-21 DIAGNOSIS — Z515 Encounter for palliative care: Secondary | ICD-10-CM

## 2013-11-21 DIAGNOSIS — E2740 Unspecified adrenocortical insufficiency: Secondary | ICD-10-CM | POA: Diagnosis present

## 2013-11-21 DIAGNOSIS — N390 Urinary tract infection, site not specified: Secondary | ICD-10-CM | POA: Diagnosis present

## 2013-11-21 DIAGNOSIS — R6520 Severe sepsis without septic shock: Secondary | ICD-10-CM | POA: Diagnosis present

## 2013-11-21 DIAGNOSIS — H269 Unspecified cataract: Secondary | ICD-10-CM | POA: Diagnosis present

## 2013-11-21 DIAGNOSIS — K631 Perforation of intestine (nontraumatic): Secondary | ICD-10-CM | POA: Diagnosis present

## 2013-11-21 DIAGNOSIS — F411 Generalized anxiety disorder: Secondary | ICD-10-CM | POA: Diagnosis present

## 2013-11-21 DIAGNOSIS — C61 Malignant neoplasm of prostate: Secondary | ICD-10-CM | POA: Diagnosis present

## 2013-11-21 DIAGNOSIS — C779 Secondary and unspecified malignant neoplasm of lymph node, unspecified: Secondary | ICD-10-CM

## 2013-11-21 DIAGNOSIS — R0989 Other specified symptoms and signs involving the circulatory and respiratory systems: Secondary | ICD-10-CM | POA: Diagnosis not present

## 2013-11-21 DIAGNOSIS — J9819 Other pulmonary collapse: Secondary | ICD-10-CM | POA: Diagnosis present

## 2013-11-21 DIAGNOSIS — R188 Other ascites: Secondary | ICD-10-CM | POA: Diagnosis present

## 2013-11-21 DIAGNOSIS — E872 Acidosis, unspecified: Secondary | ICD-10-CM | POA: Diagnosis present

## 2013-11-21 DIAGNOSIS — E785 Hyperlipidemia, unspecified: Secondary | ICD-10-CM | POA: Diagnosis present

## 2013-11-21 DIAGNOSIS — E870 Hyperosmolality and hypernatremia: Secondary | ICD-10-CM | POA: Diagnosis present

## 2013-11-21 DIAGNOSIS — R6521 Severe sepsis with septic shock: Secondary | ICD-10-CM

## 2013-11-21 DIAGNOSIS — Z79899 Other long term (current) drug therapy: Secondary | ICD-10-CM

## 2013-11-21 DIAGNOSIS — C50919 Malignant neoplasm of unspecified site of unspecified female breast: Secondary | ICD-10-CM | POA: Diagnosis present

## 2013-11-21 DIAGNOSIS — F0280 Dementia in other diseases classified elsewhere without behavioral disturbance: Secondary | ICD-10-CM | POA: Diagnosis present

## 2013-11-21 DIAGNOSIS — E2749 Other adrenocortical insufficiency: Secondary | ICD-10-CM | POA: Diagnosis present

## 2013-11-21 DIAGNOSIS — R0609 Other forms of dyspnea: Secondary | ICD-10-CM | POA: Diagnosis not present

## 2013-11-21 DIAGNOSIS — E441 Mild protein-calorie malnutrition: Secondary | ICD-10-CM | POA: Diagnosis present

## 2013-11-21 DIAGNOSIS — A419 Sepsis, unspecified organism: Secondary | ICD-10-CM | POA: Diagnosis present

## 2013-11-21 DIAGNOSIS — R0682 Tachypnea, not elsewhere classified: Secondary | ICD-10-CM | POA: Diagnosis not present

## 2013-11-21 DIAGNOSIS — J189 Pneumonia, unspecified organism: Secondary | ICD-10-CM | POA: Diagnosis present

## 2013-11-21 DIAGNOSIS — F0390 Unspecified dementia without behavioral disturbance: Secondary | ICD-10-CM | POA: Diagnosis present

## 2013-11-21 DIAGNOSIS — Z66 Do not resuscitate: Secondary | ICD-10-CM | POA: Diagnosis present

## 2013-11-21 DIAGNOSIS — Z8546 Personal history of malignant neoplasm of prostate: Secondary | ICD-10-CM

## 2013-11-21 DIAGNOSIS — Z681 Body mass index (BMI) 19 or less, adult: Secondary | ICD-10-CM

## 2013-11-21 DIAGNOSIS — G309 Alzheimer's disease, unspecified: Secondary | ICD-10-CM | POA: Diagnosis present

## 2013-11-21 LAB — I-STAT CG4 LACTIC ACID, ED: Lactic Acid, Venous: 9.49 mmol/L — ABNORMAL HIGH (ref 0.5–2.2)

## 2013-11-21 LAB — URINALYSIS, ROUTINE W REFLEX MICROSCOPIC
Bilirubin Urine: NEGATIVE
Glucose, UA: NEGATIVE mg/dL
Ketones, ur: NEGATIVE mg/dL
LEUKOCYTES UA: NEGATIVE
Nitrite: NEGATIVE
PROTEIN: 30 mg/dL — AB
Specific Gravity, Urine: 1.028 (ref 1.005–1.030)
UROBILINOGEN UA: 0.2 mg/dL (ref 0.0–1.0)
pH: 5.5 (ref 5.0–8.0)

## 2013-11-21 LAB — COMPREHENSIVE METABOLIC PANEL
ALT: 15 U/L (ref 0–53)
AST: 32 U/L (ref 0–37)
Albumin: 2.5 g/dL — ABNORMAL LOW (ref 3.5–5.2)
Alkaline Phosphatase: 157 U/L — ABNORMAL HIGH (ref 39–117)
BUN: 29 mg/dL — ABNORMAL HIGH (ref 6–23)
CALCIUM: 9.6 mg/dL (ref 8.4–10.5)
CHLORIDE: 111 meq/L (ref 96–112)
CO2: 20 meq/L (ref 19–32)
Creatinine, Ser: 1.14 mg/dL (ref 0.50–1.35)
GFR calc Af Amer: 63 mL/min — ABNORMAL LOW (ref >90)
GFR, EST NON AFRICAN AMERICAN: 55 mL/min — AB (ref >90)
GLUCOSE: 80 mg/dL (ref 70–99)
Potassium: 4 mEq/L (ref 3.7–5.3)
Sodium: 154 mEq/L — ABNORMAL HIGH (ref 137–147)
Total Bilirubin: 1.6 mg/dL — ABNORMAL HIGH (ref 0.3–1.2)
Total Protein: 6 g/dL (ref 6.0–8.3)

## 2013-11-21 LAB — CBC WITH DIFFERENTIAL/PLATELET
BASOS PCT: 0 % (ref 0–1)
Basophils Absolute: 0 10*3/uL (ref 0.0–0.1)
EOS ABS: 0 10*3/uL (ref 0.0–0.7)
Eosinophils Relative: 0 % (ref 0–5)
HCT: 43.7 % (ref 39.0–52.0)
HEMOGLOBIN: 14.1 g/dL (ref 13.0–17.0)
Lymphocytes Relative: 6 % — ABNORMAL LOW (ref 12–46)
Lymphs Abs: 2.4 10*3/uL (ref 0.7–4.0)
MCH: 30 pg (ref 26.0–34.0)
MCHC: 32.3 g/dL (ref 30.0–36.0)
MCV: 93 fL (ref 78.0–100.0)
MONO ABS: 4 10*3/uL — AB (ref 0.1–1.0)
Monocytes Relative: 10 % (ref 3–12)
NEUTROS PCT: 84 % — AB (ref 43–77)
Neutro Abs: 33.5 10*3/uL — ABNORMAL HIGH (ref 1.7–7.7)
Platelets: 150 10*3/uL (ref 150–400)
RBC: 4.7 MIL/uL (ref 4.22–5.81)
RDW: 20.9 % — ABNORMAL HIGH (ref 11.5–15.5)
WBC Morphology: INCREASED
WBC: 39.9 10*3/uL — ABNORMAL HIGH (ref 4.0–10.5)

## 2013-11-21 LAB — MRSA PCR SCREENING: MRSA by PCR: NEGATIVE

## 2013-11-21 LAB — URINE MICROSCOPIC-ADD ON

## 2013-11-21 LAB — PROTIME-INR
INR: 1.4 (ref 0.00–1.49)
PROTHROMBIN TIME: 16.8 s — AB (ref 11.6–15.2)

## 2013-11-21 LAB — CORTISOL: CORTISOL PLASMA: 57.3 ug/dL

## 2013-11-21 LAB — APTT: APTT: 28 s (ref 24–37)

## 2013-11-21 MED ORDER — SODIUM CHLORIDE 0.9 % IV SOLN
1000.0000 mL | Freq: Once | INTRAVENOUS | Status: AC
  Administered 2013-11-21: 1000 mL via INTRAVENOUS

## 2013-11-21 MED ORDER — MORPHINE SULFATE 2 MG/ML IJ SOLN
1.0000 mg | INTRAMUSCULAR | Status: DC | PRN
  Administered 2013-11-21 – 2013-11-23 (×4): 2 mg via INTRAVENOUS
  Filled 2013-11-21 (×5): qty 1

## 2013-11-21 MED ORDER — VANCOMYCIN HCL 500 MG IV SOLR
500.0000 mg | Freq: Two times a day (BID) | INTRAVENOUS | Status: DC
  Administered 2013-11-22: 500 mg via INTRAVENOUS
  Filled 2013-11-21 (×3): qty 500

## 2013-11-21 MED ORDER — ENOXAPARIN SODIUM 40 MG/0.4ML ~~LOC~~ SOLN
40.0000 mg | Freq: Every day | SUBCUTANEOUS | Status: DC
  Administered 2013-11-21: 40 mg via SUBCUTANEOUS
  Filled 2013-11-21 (×3): qty 0.4

## 2013-11-21 MED ORDER — HYDROCORTISONE NA SUCCINATE PF 100 MG IJ SOLR
100.0000 mg | Freq: Three times a day (TID) | INTRAMUSCULAR | Status: DC
  Administered 2013-11-21 – 2013-11-22 (×3): 100 mg via INTRAVENOUS
  Filled 2013-11-21 (×6): qty 2

## 2013-11-21 MED ORDER — MEMANTINE HCL 10 MG PO TABS
10.0000 mg | ORAL_TABLET | Freq: Every morning | ORAL | Status: DC
  Filled 2013-11-21: qty 1

## 2013-11-21 MED ORDER — DONEPEZIL HCL 5 MG PO TABS
5.0000 mg | ORAL_TABLET | Freq: Every day | ORAL | Status: DC
  Filled 2013-11-21 (×3): qty 1

## 2013-11-21 MED ORDER — ACETAMINOPHEN 325 MG PO TABS
650.0000 mg | ORAL_TABLET | Freq: Four times a day (QID) | ORAL | Status: DC | PRN
  Administered 2013-11-21: 650 mg via ORAL
  Filled 2013-11-21: qty 2

## 2013-11-21 MED ORDER — MIRTAZAPINE 7.5 MG PO TABS
7.5000 mg | ORAL_TABLET | Freq: Every day | ORAL | Status: DC
  Filled 2013-11-21 (×3): qty 1

## 2013-11-21 MED ORDER — MORPHINE SULFATE 2 MG/ML IJ SOLN
2.0000 mg | INTRAMUSCULAR | Status: DC | PRN
  Administered 2013-11-21: 2 mg via INTRAVENOUS
  Filled 2013-11-21: qty 1

## 2013-11-21 MED ORDER — SODIUM CHLORIDE 0.9 % IV SOLN
INTRAVENOUS | Status: AC
  Administered 2013-11-21: 14:00:00 via INTRAVENOUS

## 2013-11-21 MED ORDER — PIPERACILLIN-TAZOBACTAM 3.375 G IVPB 30 MIN
3.3750 g | Freq: Once | INTRAVENOUS | Status: AC
  Administered 2013-11-21: 3.375 g via INTRAVENOUS
  Filled 2013-11-21: qty 50

## 2013-11-21 MED ORDER — SODIUM CHLORIDE 0.9 % IV SOLN: 1000.0000 mL | INTRAVENOUS | Status: DC

## 2013-11-21 MED ORDER — ACETAMINOPHEN 325 MG PO TABS: 650.0000 mg | ORAL_TABLET | Freq: Three times a day (TID) | ORAL | Status: DC | PRN

## 2013-11-21 MED ORDER — PIPERACILLIN-TAZOBACTAM 3.375 G IVPB
3.3750 g | Freq: Three times a day (TID) | INTRAVENOUS | Status: DC
  Administered 2013-11-21 – 2013-11-22 (×3): 3.375 g via INTRAVENOUS
  Filled 2013-11-21 (×4): qty 50

## 2013-11-21 MED ORDER — LORAZEPAM 2 MG/ML IJ SOLN
2.0000 mg | INTRAMUSCULAR | Status: DC | PRN
  Administered 2013-11-22: 2 mg via INTRAVENOUS
  Filled 2013-11-21: qty 1

## 2013-11-21 MED ORDER — VANCOMYCIN HCL 10 G IV SOLR
1500.0000 mg | Freq: Once | INTRAVENOUS | Status: AC
  Administered 2013-11-21: 1500 mg via INTRAVENOUS
  Filled 2013-11-21: qty 1500

## 2013-11-21 MED ORDER — IOHEXOL 300 MG/ML  SOLN: 50.0000 mL | Freq: Once | INTRAMUSCULAR | Status: DC | PRN

## 2013-11-21 MED ORDER — IOHEXOL 300 MG/ML  SOLN
80.0000 mL | Freq: Once | INTRAMUSCULAR | Status: AC | PRN
  Administered 2013-11-21: 80 mL via INTRAVENOUS

## 2013-11-21 MED ORDER — SODIUM CHLORIDE 0.9 % IV SOLN
1000.0000 mL | INTRAVENOUS | Status: DC
Start: 2013-11-21 — End: 2013-11-23

## 2013-11-21 MED ORDER — SODIUM CHLORIDE 0.9 % IV SOLN
1000.0000 mL | INTRAVENOUS | Status: DC
  Administered 2013-11-21: 1000 mL via INTRAVENOUS

## 2013-11-21 NOTE — ED Notes (Signed)
From [place], wife told EMS that pt has not been able to urinate since last evening, Staff tried unsuccessfully to place a cath this am. Pt also has skin breakdown on bottom. Hx of dementia

## 2013-11-21 NOTE — Progress Notes (Signed)
ANTIBIOTIC CONSULT NOTE - INITIAL  Pharmacy Consult for vancomycin and Zosyn Indication: sepsis  No Known Allergies  Patient Measurements: Bosy weight: 60.1kg on 1/28 IBW: 73kg  Vital Signs: Temp: 102.8 F (39.3 C) (02/28 1041) Temp src: Rectal (02/28 1041) BP: 97/55 mmHg (02/28 1048)  Medical History: Past Medical History  Diagnosis Date  . Alzheimer disease   . Hyperlipidemia   . H/O prostate cancer   . Cataract   . Cancer of skin     HAD FOR YEARS, REMOVED FOR YEARS    Medications:  Scheduled:   Infusions:  . sodium chloride     Followed by  . sodium chloride    . piperacillin-tazobactam    . vancomycin     Assessment: 4 yoM admitted 2/28 from SNF with anuria and skin breakdown on bottom. Recent hospitalization 1/24-1/29 with influenza and pneumonia. Code sepsis was called upon arrival to the ED with fever, hypotension, leukocytosis, increased respiratory rate, and elevated lactic acid. Pharmacy has been consulted to dose vancomycin and Zosyn for sepsis.  Antiinfectives 2/28 >> Zosyn >> 2/28 >> vancomycin >>    Tmax: 102.8 WBCs: 39.9 Renal: Scr 1.14 (baseline SCr=0.8), CrCl 36 ml/min CG, 44 ml/min normlaized Lactic acid: 9.49 (2/28)  Microbiology 2/28 blood x2: sent 2/28 urine: sent   Drug level / dose changes info:  2/28: D1 vancomycin 1500mg  x1 then 500mg  q12h/ ZEI for sepsis   Goal of Therapy:  Vancomycin trough level 15-20 mcg/ml eradication of infection  Plan:  - vancomycin 1500mg  IV x 1 - vancomycin 500mg  q12h to start at midnight - Zosyn 3.375G IV q8h to be infused over 4 hours - vancomycin trough at steady state if indicated - follow-up clinical course, culture results, renal function - follow-up antibiotic de-escalation and length of therapy  Thank you for the consult.  Johny Drilling, PharmD, BCPS Pager: [phone] Pharmacy: [phone] 11/13/2013 12:10 PM

## 2013-11-21 NOTE — ED Notes (Signed)
Dr Karle Starch made aware of abnormal lactic acid results.

## 2013-11-21 NOTE — Progress Notes (Signed)
Called by nurse regarding the ct scan result, which shows ascites, bowel perforation, multiple abscesses, widespread metastatic disease from prostrate cancer.  Patient continues to  be in pain requiring morphine.  Blood pressure is labile.  Discussed with patients wife in detail, they were told about perforation and above ct findings, they understand that patient is not a surgical candidate and comfort care is the best approach at this point of time.   Family agrees with comfort care. PLAN 1. D/C ivy fluids 2. Start morphine 1 to 2 mg q 2 hrs.prn 3. Ativan 2 mg q 4 hrs. prn for anxiety and sedation 4. Will continue with iv antibiotics at this time. 5. Not a surgical candidate at this time 6. Initiate comfort measures  Very poor prognosis, patient may die in the next few hours. Family aware of above plan.

## 2013-11-21 NOTE — ED Notes (Signed)
Bed: KW40 Expected date: 11/19/2013 Expected time: 10:38 AM Means of arrival: Ambulance Comments: Unable to urinate

## 2013-11-21 NOTE — Progress Notes (Signed)
PCP:  Vincent Hopper, MD  Chief Complaint:  Dysuria  HPI:  80-year-old male who has a past medical history of Alzheimer disease; Hyperlipidemia; H/O prostate cancer; Cataract; and Cancer of skin.  Today was brought to the hospital after patient had difficulty making urine, nursing staff at the assisted living facility were unable to catheterization the patient. Patient has a history of dementia, he has been treated with Levaquin then Avelox for last 10 days for possible pneumonia.  In the ED patient was found to be profoundly hypotensive, code sepsis was initiated. Patient was found to have abnormal UA with chest x-ray showing bibasilar infiltrates or atelectasis left more than right, vancomycin and Zosyn for initiated. At this time patient complains of abdominal pain, Foley catheter has been inserted and surprisingly patient was not found to have urinary retention.  He was found to have leukocytosis with white count of 39,000, lactate 9.49, the patient is alert but a poor historian. Patient's daughter and wife are at bedside.  Allergies:  No Known Allergies    Past Medical History    Diagnosis  Date    .  Alzheimer disease     .  Hyperlipidemia     .  H/O prostate cancer     .  Cataract     .  Cancer of skin       HAD FOR YEARS, REMOVED FOR YEARS       Past Surgical History    Procedure  Laterality  Date    .  High dose radiation implant insertion        for prostate ca    .  Prostate surgery         Prior to Admission medications    Medication  Sig  Start Date  End Date  Taking?  Authorizing Provider    acetaminophen (TYLENOL) 325 MG tablet  Take 650 mg by mouth every 8 (eight) hours as needed for mild pain or fever.    Yes  Historical Provider, MD    albuterol (PROVENTIL) (2.5 MG/3ML) 0.083% nebulizer solution  Take 3 mLs (2.5 mg total) by nebulization every 6 (six) hours as needed for wheezing or shortness of breath.  10/22/13   Yes  Michael D Rigby, DO    alum & mag hydroxide-simeth  (MAALOX/MYLANTA) 200-200-20 MG/5ML suspension  Take 30 mLs by mouth every 4 (four) hours as needed for indigestion or heartburn.    Yes  Historical Provider, MD    donepezil (ARICEPT) 5 MG tablet  Take 5 mg by mouth at bedtime.    Yes  Historical Provider, MD    memantine (NAMENDA) 10 MG tablet  Take 10 mg by mouth every morning.    Yes  Historical Provider, MD    mirtazapine (REMERON) 7.5 MG tablet  Take 7.5 mg by mouth at bedtime.    Yes  Historical Provider, MD    Multiple Vitamin (MULTIVITAMIN WITH MINERALS) TABS tablet  Take 1 tablet by mouth every morning.    Yes  Historical Provider, MD    Social History:  reports that he has never smoked. He has never used smokeless tobacco. He reports that he does not drink alcohol or use illicit drugs.    Family History    Problem  Relation  Age of Onset    .  Cancer  Brother       ? lung    .  Diabetes  Brother       not definite    .    Heart disease  Neg Hx     .  Stroke  Neg Hx     .  Dementia  Mother     .  Diverticulitis  Father      All the positives are listed in BOLD  Review of Systems:  HEENT: Headache, blurred vision, runny nose, sore throat  Neck: Hypothyroidism, hyperthyroidism,,lymphadenopathy  Chest : Shortness of breath, history of COPD, Asthma  Heart : Chest pain, history of coronary arterey disease  GI: Nausea, vomiting, diarrhea, constipation, GERD  GU: Dysuria, history of prostate cancer  Neuro: Stroke, seizures, syncope  Psych: Depression, anxiety, hallucinations  Physical Exam:  Blood pressure 110/53, pulse 110, temperature 97.3 F (36.3 C), temperature source Oral, resp. rate 25, SpO2 99.00%.  Constitutional: Patient is a well-developed and well-nourished male* in no acute distress and cooperative with exam.  Head: Normocephalic and atraumatic  Mouth: Mucus membranes moist  Eyes: PERRL, EOMI, conjunctivae normal  Neck: Supple, No Thyromegaly  Cardiovascular: RRR, S1 normal, S2 normal  Pulmonary/Chest: CTAB, no  wheezes, rales, or rhonchi  Abdominal: Soft. Positive tenderness to palpation, mild distention, mild guarding, bowel sounds are normal, no masses, organomegaly, or guarding present.  Neurological: A&O x3, Strenght is normal and symmetric bilaterally, cranial nerve II-XII are grossly intact, no focal motor deficit, sensory intact to light touch bilaterally.  Extremities : No Cyanosis, Clubbing or Edema  Labs on Admission:    Results for orders placed during the hospital encounter of 11/14/2013 (from the past 48 hour(s))    PROTIME-INR Status: Abnormal     Collection Time     11/06/2013 11:00 AM    Result  Value  Ref Range     Prothrombin Time  16.8 (*)  11.6 - 15.2 seconds     INR  1.40  0.00 - 1.49    APTT Status: None     Collection Time     11/12/2013 11:00 AM    Result  Value  Ref Range     aPTT  28  24 - 37 seconds    CBC WITH DIFFERENTIAL Status: Abnormal     Collection Time     10/29/2013 11:08 AM    Result  Value  Ref Range     WBC  39.9 (*)  4.0 - 10.5 K/uL     RBC  4.70  4.22 - 5.81 MIL/uL     Hemoglobin  14.1  13.0 - 17.0 g/dL     HCT  43.7  39.0 - 52.0 %     MCV  93.0  78.0 - 100.0 fL     MCH  30.0  26.0 - 34.0 pg     MCHC  32.3  30.0 - 36.0 g/dL     RDW  20.9 (*)  11.5 - 15.5 %     Platelets  150  150 - 400 K/uL     Neutrophils Relative %  84 (*)  43 - 77 %     Lymphocytes Relative  6 (*)  12 - 46 %     Monocytes Relative  10  3 - 12 %     Eosinophils Relative  0  0 - 5 %     Basophils Relative  0  0 - 1 %     Neutro Abs  33.5 (*)  1.7 - 7.7 K/uL     Lymphs Abs  2.4  0.7 - 4.0 K/uL     Monocytes Absolute  4.0 (*)  0.1 - 1.0   K/uL     Eosinophils Absolute  0.0  0.0 - 0.7 K/uL     Basophils Absolute  0.0  0.0 - 0.1 K/uL     WBC Morphology  INCREASED BANDS (>20% BANDS)      Smear Review  LARGE PLATELETS PRESENT     COMPREHENSIVE METABOLIC PANEL Status: Abnormal     Collection Time     11/17/2013 11:08 AM    Result  Value  Ref Range     Sodium  154 (*)  137 - 147 mEq/L      Potassium  4.0  3.7 - 5.3 mEq/L     Chloride  111  96 - 112 mEq/L     CO2  20  19 - 32 mEq/L     Glucose, Bld  80  70 - 99 mg/dL     BUN  29 (*)  6 - 23 mg/dL     Creatinine, Ser  1.14  0.50 - 1.35 mg/dL     Calcium  9.6  8.4 - 10.5 mg/dL     Total Protein  6.0  6.0 - 8.3 g/dL     Albumin  2.5 (*)  3.5 - 5.2 g/dL     AST  32  0 - 37 U/L     ALT  15  0 - 53 U/L     Alkaline Phosphatase  157 (*)  39 - 117 U/L     Total Bilirubin  1.6 (*)  0.3 - 1.2 mg/dL     GFR calc non Af Amer  55 (*)  >90 mL/min     GFR calc Af Amer  63 (*)  >90 mL/min     Comment:  (NOTE)      The eGFR has been calculated using the CKD EPI equation.      This calculation has not been validated in all clinical situations.      eGFR's persistently <90 mL/min signify possible Chronic Kidney      Disease.    I-STAT CG4 LACTIC ACID, ED Status: Abnormal     Collection Time     10/26/2013 11:18 AM    Result  Value  Ref Range     Lactic Acid, Venous  9.49 (*)  0.5 - 2.2 mmol/L    URINALYSIS, ROUTINE W REFLEX MICROSCOPIC Status: Abnormal     Collection Time     11/19/2013 11:18 AM    Result  Value  Ref Range     Color, Urine  AMBER (*)  YELLOW     Comment:  BIOCHEMICALS MAY BE AFFECTED BY COLOR     APPearance  CLOUDY (*)  CLEAR     Specific Gravity, Urine  1.028  1.005 - 1.030     pH  5.5  5.0 - 8.0     Glucose, UA  NEGATIVE  NEGATIVE mg/dL     Hgb urine dipstick  MODERATE (*)  NEGATIVE     Bilirubin Urine  NEGATIVE  NEGATIVE     Ketones, ur  NEGATIVE  NEGATIVE mg/dL     Protein, ur  30 (*)  NEGATIVE mg/dL     Urobilinogen, UA  0.2  0.0 - 1.0 mg/dL     Nitrite  NEGATIVE  NEGATIVE     Leukocytes, UA  NEGATIVE  NEGATIVE    URINE MICROSCOPIC-ADD ON Status: None     Collection Time     11/18/2013 11:18 AM    Result  Value  Ref Range       WBC, UA  0-2  <3 WBC/hpf     RBC / HPF  11-20  <3 RBC/hpf     Bacteria, UA  RARE  RARE     Urine-Other  AMORPHOUS URATES/PHOSPHATES      Radiological Exams on Admission:  Dg Chest  Port 1 View  11/19/2013 CLINICAL DATA: Demented, community acquired pneumonia EXAM: PORTABLE CHEST - 1 VIEW COMPARISON: 10/18/2013 FINDINGS: Persistent patchy subsegmental atelectasis or infiltrates at both lung bases, left greater than right. No new infiltrate. Heart size normal. Atheromatous aorta. . No effusion. Visualized skeletal structures are unremarkable. IMPRESSION: Little change in bibasilar infiltrates or atelectasis, left greater than right. Electronically Signed By: Daniel Hassell M.D. On: 11/13/2013 11:30   Assessment/Plan  Principal Problem:  Sepsis  Active Problems:  Dementia  Prostate Cancer, Personal History of - S/p Seed implants 2000  Adrenal insufficiency, presumed  Severe sepsis  Hypernatremia  Sepsis/ septic shock  At this time patient is being admitted to step down unit with diagnoses of sepsis, patient will be started on IV fluids. Will also start hydrocortisone 100 mg every 8 hours as patient has presumed adrenal insufficiency. We'll continue with vancomycin and Zosyn. Likely cause of the sepsis is UTI, but patient also has abdominal tenderness to palpation along with elevated lactic acid so will also get CT abdomen pelvis to rule out underlying perforation. Patient has a history of prostate cancer with possible metastases  Dementia  Continue Namenda and Aricept  We'll continue Remeron at bedtime  Hypernatremia  Sodium was 154, once patient is more stable will change IV fluids to D5 W. as needed for hypernatremia  HCAP  Patient will be started on vancomycin and Zosyn. We'll follow blood cultures.  UTI  Patient has been started on Zosyn, UA is not grossly abnormal but he did have difficulty with urination and has been treated with Levaquin and Avelox for last 10 days. Will follow the urine culture results.  Goals of care discussion  I discussed in detail with patient's wife and daughter at bedside, patient is a DO NOT RESUSCITATE and they understand poor prognosis.  They do not wish to pursue aggressive measures if patient starts to decline clinically. We'll continue with the current management at this time  Code status:  Time Spent on Admission:  75 min  Reichen Hutzler S  [facility]  Pager: [phone]  11/06/2013, 1:45 PM  If 7PM-7AM, please contact night-coverage  [email or web address]  Password [email or web address]        

## 2013-11-21 NOTE — ED Provider Notes (Signed)
CSN: [id number]     Arrival date & time 11/10/2013  1037 History   First MD Initiated Contact with Patient 11/18/2013 1059     Chief Complaint  Patient presents with  . Dysuria     (Consider location/radiation/quality/duration/timing/severity/associated sxs/prior Treatment) Patient is a 80 y.o. male presenting with dysuria.  Dysuria   Level 5 caveat due to dementia and need for intervention Pt with history of dementia and prostate cancer brought to the ED from local ALF for evaluation of urinary retention, has not voided since yesterday, staff there unable to place catheter. On arrival found to have cough with thick sputum, febrile, tachycardic and hypotensive. Code Sepsis initiated on arrival.  Past Medical History  Diagnosis Date  . Alzheimer disease   . Hyperlipidemia   . H/O prostate cancer   . Cataract   . Cancer of skin     HAD FOR YEARS, REMOVED FOR YEARS   Past Surgical History  Procedure Laterality Date  . High dose radiation implant insertion      for prostate ca  . Prostate surgery     Family History  Problem Relation Age of Onset  . Cancer Brother      ? lung  . Diabetes Brother     not definite  . Heart disease Neg Hx   . Stroke Neg Hx   . Dementia Mother   . Diverticulitis Father    History  Substance Use Topics  . Smoking status: Never Smoker   . Smokeless tobacco: Never Used  . Alcohol Use: No    Review of Systems  Genitourinary: Positive for dysuria.    Unable to assess due to mental status. '   Allergies  Review of patient's allergies indicates no known allergies.  Home Medications   Current Outpatient Rx  Name  Route  Sig  Dispense  Refill  . acetaminophen (TYLENOL) 325 MG tablet   Oral   Take 650 mg by mouth every 8 (eight) hours as needed for mild pain or fever.         Marland Kitchen albuterol (PROVENTIL) (2.5 MG/3ML) 0.083% nebulizer solution   Nebulization   Take 3 mLs (2.5 mg total) by nebulization every 6 (six) hours as needed for  wheezing or shortness of breath.      12   . alum & mag hydroxide-simeth (MAALOX/MYLANTA) 200-200-20 MG/5ML suspension   Oral   Take 30 mLs by mouth every 4 (four) hours as needed for indigestion or heartburn.         . donepezil (ARICEPT) 5 MG tablet   Oral   Take 5 mg by mouth at bedtime.         . memantine (NAMENDA) 10 MG tablet   Oral   Take 10 mg by mouth every morning.          . mirtazapine (REMERON) 7.5 MG tablet   Oral   Take 7.5 mg by mouth at bedtime.         . Multiple Vitamin (MULTIVITAMIN WITH MINERALS) TABS tablet   Oral   Take 1 tablet by mouth every morning.          BP 97/55  Temp(Src) 102.8 F (39.3 C) (Rectal)  Resp 22  SpO2 94% Physical Exam  Nursing note and vitals reviewed. Constitutional: He appears well-developed and well-nourished.  HENT:  Head: Normocephalic and atraumatic.  Dry mouth  Eyes: EOM are normal. Pupils are equal, round, and reactive to light.  Neck: Normal range of motion.  Neck supple.  Cardiovascular: Normal heart sounds and intact distal pulses.  Tachycardia present.   Pulmonary/Chest: Effort normal. No respiratory distress. He has no wheezes. He has rales.  Abdominal: Bowel sounds are normal. He exhibits no distension and no mass (no bladder distention). There is tenderness (mod suprapubic). There is no rebound and no guarding.  Musculoskeletal: Normal range of motion. He exhibits no edema and no tenderness.  Neurological: He is alert. He has normal strength. No cranial nerve deficit or sensory deficit.  Skin: Skin is warm and dry. No rash noted.  Psychiatric: He has a normal mood and affect.    ED Course  Procedures (including critical care time) Labs Review Labs Reviewed  CBC WITH DIFFERENTIAL - Abnormal; Notable for the following:    WBC 39.9 (*)    RDW 20.9 (*)    All other components within normal limits  I-STAT CG4 LACTIC ACID, ED - Abnormal; Notable for the following:    Lactic Acid, Venous 9.49 (*)     All other components within normal limits  CULTURE, BLOOD (ROUTINE X 2)  CULTURE, BLOOD (ROUTINE X 2)  URINE CULTURE  COMPREHENSIVE METABOLIC PANEL  URINALYSIS, ROUTINE W REFLEX MICROSCOPIC  PROTIME-INR  APTT   Imaging Review Dg Chest Port 1 View  11-28-2013   CLINICAL DATA:  Demented, community acquired pneumonia  EXAM: PORTABLE CHEST - 1 VIEW  COMPARISON:  10/18/2013  FINDINGS: Persistent patchy subsegmental atelectasis or infiltrates at both lung bases, left greater than right. No new infiltrate. Heart size normal. Atheromatous aorta. . No effusion. Visualized skeletal structures are unremarkable.  IMPRESSION: Little change in bibasilar infiltrates or atelectasis, left greater than right.   Electronically Signed   By: Arne Cleveland M.D.   On: 11/28/2013 11:30     EKG Interpretation   Date/Time:  11/28/2013 10:53:50 EST Ventricular Rate:  128 PR Interval:  117 QRS Duration: 77 QT Interval:  313 QTC Calculation: 457 R Axis:   77 Text Interpretation:  Sinus tachycardia Atrial premature complex Minimal  ST depression, inferior leads Since last tracing Rate faster st depression  now present, probably rate related Confirmed by [facility]  MD, Juanda Crumble  [phone]) on 11-28-2013 11:15:51 AM      MDM   Final diagnoses:  Severe sepsis  HCAP (healthcare-associated pneumonia)  UTI (urinary tract infection)  Lactic acidosis   Review of available records from ALF shows patient has been on Levaquin then Avelox for the last 10 days. He currently meets sepsis criteria, 2000cc IVF bolus ordered. Blood cultures and broad spectrum abx initiated.   2:47 PM BP initially improved but then dropped again. Additional IVF to be given. Spoke with Hospitalist who will admit. Pt with more tender Abd now, Hospitalist will order CT abd/pel, patient transported to stepdown.   Khaliyah Northrop B. Karle Starch, MD 2013/11/28 [phone]

## 2013-11-22 LAB — CBC
HCT: 36.9 % — ABNORMAL LOW (ref 39.0–52.0)
Hemoglobin: 11.5 g/dL — ABNORMAL LOW (ref 13.0–17.0)
MCH: 29 pg (ref 26.0–34.0)
MCHC: 31.2 g/dL (ref 30.0–36.0)
MCV: 93.2 fL (ref 78.0–100.0)
PLATELETS: 121 10*3/uL — AB (ref 150–400)
RBC: 3.96 MIL/uL — ABNORMAL LOW (ref 4.22–5.81)
RDW: 21.1 % — ABNORMAL HIGH (ref 11.5–15.5)
WBC: 42.9 10*3/uL — ABNORMAL HIGH (ref 4.0–10.5)

## 2013-11-22 LAB — COMPREHENSIVE METABOLIC PANEL
ALBUMIN: 1.8 g/dL — AB (ref 3.5–5.2)
ALK PHOS: 109 U/L (ref 39–117)
ALT: 40 U/L (ref 0–53)
AST: 143 U/L — AB (ref 0–37)
BUN: 30 mg/dL — ABNORMAL HIGH (ref 6–23)
CALCIUM: 7.6 mg/dL — AB (ref 8.4–10.5)
CO2: 18 mEq/L — ABNORMAL LOW (ref 19–32)
Chloride: 121 mEq/L — ABNORMAL HIGH (ref 96–112)
Creatinine, Ser: 0.97 mg/dL (ref 0.50–1.35)
GFR calc Af Amer: 82 mL/min — ABNORMAL LOW (ref >90)
GFR calc non Af Amer: 71 mL/min — ABNORMAL LOW (ref >90)
Glucose, Bld: 81 mg/dL (ref 70–99)
Potassium: 4 mEq/L (ref 3.7–5.3)
SODIUM: 154 meq/L — AB (ref 137–147)
TOTAL PROTEIN: 4.7 g/dL — AB (ref 6.0–8.3)
Total Bilirubin: 1 mg/dL (ref 0.3–1.2)

## 2013-11-22 MED ORDER — MORPHINE SULFATE 2 MG/ML IJ SOLN
2.0000 mg | INTRAMUSCULAR | Status: DC
  Administered 2013-11-22 – 2013-11-23 (×3): 2 mg via INTRAVENOUS
  Filled 2013-11-22 (×4): qty 1

## 2013-11-22 NOTE — Progress Notes (Signed)
[facility] PROGRESS NOTE  Vincent Lee JME:[id number] DOB: 03-06-1921 DOA: 12-Dec-2013 PCP: Unice Cobble, MD  Assessment/Plan:  Sepsis/ septic shock  Patient was admitted to step down with diagnoses of sepsis, started on hydrocortisone, vancomycin and Zosyn. CT abdomen pelvis showed underlying bowel perforation, multiple abscesses, ascites, metastatic prostate cancer. Patient has been made comfort care, so we'll discontinue the antibiotics at this time  Dementia  Will hold by mouth medications at this time. Patient is total comfort care  Comfort care Discussed with family in detail, they do not want aggressive measures. We'll start scheduled morphine 2 mg IV every 4 hours along with morphine 2 mg IV every 2 hours when necessary. Ativan 2 mg IV every 4 hours when necessary.  Code Status: DNR  Family Communication:Discussed with patients wife in detail, they were told about perforation and above ct findings, they understand that patient is not a surgical candidate and comfort care is the best approach at this point of time. Family agrees with comfort care.   *Disposition Plan: Anticipate hospital death    Consultants:  None  Procedures:  None  Antibiotics:  Vancomycin 2/28--  Zosyn 2/28--  HPI/Subjective: 80 year old male who has a past medical history of Alzheimer disease; Hyperlipidemia; H/O prostate cancer; Cataract; and Cancer of skin.  Today was brought to the hospital after patient had difficulty making urine, nursing staff at the assisted living facility were unable to catheterization the patient. Patient has a history of dementia, he has been treated with Levaquin then Avelox for last 10 days for possible pneumonia.  In the ED patient was found to be profoundly hypotensive, code sepsis was initiated. Patient was found to have abnormal UA with chest x-ray showing bibasilar infiltrates or atelectasis left more than right, vancomycin and Zosyn for initiated.  At this time patient complains of abdominal pain, Foley catheter has been inserted and surprisingly patient was not found to have urinary retention.  He was found to have leukocytosis with white count of 39,000, lactate 9.49, the patient is alert but a poor historian. Patient's daughter and wife are at bedside. ct scan result, which shows ascites, bowel perforation, multiple abscesses, widespread metastatic disease from prostrate cancer  Patient seen and examined, made comfort care after discussion with his wife.  Objective: Filed Vitals:   11/22/13 0800  BP: 89/58  Pulse: 109  Temp: 97.9 F (36.6 C)  Resp: 23    Intake/Output Summary (Last 24 hours) at 11/22/13 1017 Last data filed at 11/22/13 0800  Gross per 24 hour  Intake   1775 ml  Output    650 ml  Net   1125 ml   Filed Weights   12/12/13 1706 11/22/13 0000  Weight: 55.5 kg (122 lb 5.7 oz) 56.1 kg (123 lb 10.9 oz)    Exam: Physical Exam: Head: Normocephalic, atraumatic.  Eyes: No signs of jaundice, as patient is comfort Nose: Mucous membranes dry.  Throat: Oropharynx nonerythematous, no exudate appreciated.  Neck: supple,No deformities, masses, or tenderness noted. Lungs: Normal respiratory effort. B/L Clear to auscultation, no crackles or wheezes.  Heart: Regular RR. S1 and S2 normal  Abdomen: BS normoactive. Soft, Nondistended, non-tender.  Extremities: No pretibial edema, no erythema   Data Reviewed: Basic Metabolic Panel:  Recent Labs Lab 12-12-2013 1108 11/22/13 0337  NA 154* 154*  K 4.0 4.0  CL 111 121*  CO2 20 18*  GLUCOSE 80 81  BUN 29* 30*  CREATININE 1.14 0.97  CALCIUM 9.6 7.6*   Liver Function  Tests:  Recent Labs Lab 11/07/2013 1108 11/22/13 0337  AST 32 143*  ALT 15 40  ALKPHOS 157* 109  BILITOT 1.6* 1.0  PROT 6.0 4.7*  ALBUMIN 2.5* 1.8*   No results found for this basename: LIPASE, AMYLASE,  in the last 168 hours No results found for this basename: AMMONIA,  in the last 168  hours CBC:  Recent Labs Lab 11/13/2013 1108 11/22/13 0337  WBC 39.9* 42.9*  NEUTROABS 33.5*  --   HGB 14.1 11.5*  HCT 43.7 36.9*  MCV 93.0 93.2  PLT 150 121*   Cardiac Enzymes: No results found for this basename: CKTOTAL, CKMB, CKMBINDEX, TROPONINI,  in the last 168 hours BNP (last 3 results)  Recent Labs  10/17/13 1045  PROBNP 304.2   CBG: No results found for this basename: GLUCAP,  in the last 168 hours  Recent Results (from the past 240 hour(s))  MRSA PCR SCREENING     Status: None   Collection Time    10/28/2013  2:20 PM      Result Value Ref Range Status   MRSA by PCR NEGATIVE  NEGATIVE Final   Comment:            The GeneXpert MRSA Assay (FDA     approved for NASAL specimens     only), is one component of a     comprehensive MRSA colonization     surveillance program. It is not     intended to diagnose MRSA     infection nor to guide or     monitor treatment for     MRSA infections.     Studies: Ct Abdomen Pelvis W Contrast  11/20/2013   CLINICAL DATA:  Demented. Hyperlipidemia. Prostate cancer. Skin cancer. Decreased urine output. Abdominal pain. Evaluate for perforation or ischemic bowel.  EXAM: CT ABDOMEN AND PELVIS WITH CONTRAST  TECHNIQUE: Multidetector CT imaging of the abdomen and pelvis was performed using the standard protocol following bolus administration of intravenous contrast.  CONTRAST:  33mL OMNIPAQUE IOHEXOL 300 MG/ML  SOLN  COMPARISON:  CT ABD/PELVIS W CM dated 02/15/2013; DG CHEST 1V PORT dated 10/31/2013  FINDINGS: Lower Chest: Left greater than right patchy bibasilar airspace disease. Mild cardiomegaly with a small hiatal hernia. Small bilateral pleural effusions.  Abdomen/Pelvis: Development of multiple low-density liver lesions, consistent with metastatic disease. Index posterior segment right liver lobe lesion measures 4.4 x 3.7 cm on image 18. Pericholecystic liver lesion measures 4.3 x 3.8 cm on image 26.  Normal spleen, distal stomach.  Transverse duodenal diverticulum. Normal pancreas, gallbladder, biliary tract, right adrenal gland. Mild left adrenal thickening is felt to be similar.  Mild bilateral renal atrophy. Too small to characterize lower pole left renal lesion. Interpolar left renal cyst measures 3.7 cm.  Aortic and branch vessel atherosclerosis. No retroperitoneal or retrocrural adenopathy.  Low pelvis minimally excluded. The sigmoid wall is thickened, including on image 74/ series 2. Extensive diverticular on this region and throughout the remainder of the colon.  Normal terminal ileum. Appendix not well visualized. No evidence of bowel obstruction. Prominent nodes identified in the gastrohepatic ligament, including a 1.5 x 1.5 cm node on image 25. This is new. Small volume extraluminal air identified, including within the right-sided abdomen on image 57 and in the perihepatic space on image 22. Complex fluid collections are identified throughout the abdomen and pelvis with areas of peritoneal thickening, suggesting infected ascites and developing abscesses. Example in the right pelvis at 9.2 x 3.1 cm on  image 71. Also in the perihepatic space.  Extensive omental and peritoneal nodularity, highly suspicious for metastatic disease. Example nodule in the left omentum on image 46 measuring 1.8 cm. These areas are new.  Fluid is seen within a a left inguinal hernia, this is unchanged.  No pelvic adenopathy. Foley catheter within the urinary bladder. Radiation seeds surround the prostate.  Bones/Musculoskeletal: Probable bone island in the right side of the sacrum. Degenerative disc disease at the lumbosacral junction.  IMPRESSION: 1. Foci of extraluminal air, most consistent with bowel perforation. Difficult to localize the source. Given the colonic wall thickening, this could arise from inflammatory or neoplastic process in the colon. Alternatively, given perigastric adenopathy, a gastric source would be an alternate consideration. 2.  Widespread metastatic disease, including within the liver, nodal stations, and peritoneum. 3. Multiple fluid collections with peritoneal enhancement. Suspicious for infected ascites and developing abscesses. 4. Small bilateral pleural effusions with bibasilar airspace disease, suspicious for infection. Critical test results telephoned to Freda Munro, r.n. at the time of interpretation at 5:49 p.m.on 11/02/2013.   Electronically Signed   By: Abigail Miyamoto M.D.   On: 11/13/2013 17:50   Dg Chest Port 1 View  11/05/2013   CLINICAL DATA:  Demented, community acquired pneumonia  EXAM: PORTABLE CHEST - 1 VIEW  COMPARISON:  10/18/2013  FINDINGS: Persistent patchy subsegmental atelectasis or infiltrates at both lung bases, left greater than right. No new infiltrate. Heart size normal. Atheromatous aorta. . No effusion. Visualized skeletal structures are unremarkable.  IMPRESSION: Little change in bibasilar infiltrates or atelectasis, left greater than right.   Electronically Signed   By: Arne Cleveland M.D.   On: 11/09/2013 11:30    Scheduled Meds: . donepezil  5 mg Oral QHS  . enoxaparin (LOVENOX) injection  40 mg Subcutaneous QHS  . hydrocortisone sod succinate (SOLU-CORTEF) inj  100 mg Intravenous Q8H  . mirtazapine  7.5 mg Oral QHS  . piperacillin-tazobactam (ZOSYN)  IV  3.375 g Intravenous Q8H  . vancomycin  500 mg Intravenous Q12H   Continuous Infusions: . sodium chloride 1,000 mL (10/30/2013 1815)    Principal Problem:   Sepsis Active Problems:   Dementia   Prostate Cancer, Personal History of - S/p Seed implants 2000   Adrenal insufficiency, presumed   Severe sepsis    Time spent: 25 min    [facility] Pager [phone]. If 7PM-7AM, please contact night-coverage at [email or web address], password [email or web address] 11/22/2013, 10:17 AM  LOS: 1 day

## 2013-11-22 DEATH — deceased

## 2013-11-23 LAB — URINE CULTURE
Colony Count: NO GROWTH
Culture: NO GROWTH

## 2013-11-23 MED ORDER — SODIUM CHLORIDE 0.9 % IV SOLN
1.0000 mg/h | INTRAVENOUS | Status: DC
  Administered 2013-11-23: 1 mg/h via INTRAVENOUS
  Filled 2013-11-23: qty 10

## 2013-11-27 LAB — CULTURE, BLOOD (ROUTINE X 2)
Culture: NO GROWTH
Culture: NO GROWTH

## 2013-12-08 ENCOUNTER — Encounter: Payer: Self-pay | Admitting: Nurse Practitioner

## 2013-12-08 NOTE — H&P (Signed)
PCP:  Unice Cobble, MD  Chief Complaint:  Dysuria  HPI:  80 year old male who has a past medical history of Alzheimer disease; Hyperlipidemia; H/O prostate cancer; Cataract; and Cancer of skin.  Today was brought to the hospital after patient had difficulty making urine, nursing staff at the assisted living facility were unable to catheterization the patient. Patient has a history of dementia, he has been treated with Levaquin then Avelox for last 10 days for possible pneumonia.  In the ED patient was found to be profoundly hypotensive, code sepsis was initiated. Patient was found to have abnormal UA with chest x-ray showing bibasilar infiltrates or atelectasis left more than right, vancomycin and Zosyn for initiated. At this time patient complains of abdominal pain, Foley catheter has been inserted and surprisingly patient was not found to have urinary retention.  He was found to have leukocytosis with white count of 39,000, lactate 9.49, the patient is alert but a poor historian. Patient's daughter and wife are at bedside.  Allergies:  No Known Allergies    Past Medical History    Diagnosis  Date    .  Alzheimer disease     .  Hyperlipidemia     .  H/O prostate cancer     .  Cataract     .  Cancer of skin       HAD FOR YEARS, REMOVED FOR YEARS       Past Surgical History    Procedure  Laterality  Date    .  High dose radiation implant insertion        for prostate ca    .  Prostate surgery         Prior to Admission medications    Medication  Sig  Start Date  End Date  Taking?  Authorizing Provider    acetaminophen (TYLENOL) 325 MG tablet  Take 650 mg by mouth every 8 (eight) hours as needed for mild pain or fever.    Yes  Historical Provider, MD    albuterol (PROVENTIL) (2.5 MG/3ML) 0.083% nebulizer solution  Take 3 mLs (2.5 mg total) by nebulization every 6 (six) hours as needed for wheezing or shortness of breath.  10/22/13   Yes  Gerda Diss, DO    alum & mag hydroxide-simeth  (MAALOX/MYLANTA) 200-200-20 MG/5ML suspension  Take 30 mLs by mouth every 4 (four) hours as needed for indigestion or heartburn.    Yes  Historical Provider, MD    donepezil (ARICEPT) 5 MG tablet  Take 5 mg by mouth at bedtime.    Yes  Historical Provider, MD    memantine (NAMENDA) 10 MG tablet  Take 10 mg by mouth every morning.    Yes  Historical Provider, MD    mirtazapine (REMERON) 7.5 MG tablet  Take 7.5 mg by mouth at bedtime.    Yes  Historical Provider, MD    Multiple Vitamin (MULTIVITAMIN WITH MINERALS) TABS tablet  Take 1 tablet by mouth every morning.    Yes  Historical Provider, MD    Social History:  reports that he has never smoked. He has never used smokeless tobacco. He reports that he does not drink alcohol or use illicit drugs.    Family History    Problem  Relation  Age of Onset    .  Cancer  Brother       ? lung    .  Diabetes  Brother       not definite    .  Heart disease  Neg Hx     .  Stroke  Neg Hx     .  Dementia  Mother     .  Diverticulitis  Father      All the positives are listed in BOLD  Review of Systems:  HEENT: Headache, blurred vision, runny nose, sore throat  Neck: Hypothyroidism, hyperthyroidism,,lymphadenopathy  Chest : Shortness of breath, history of COPD, Asthma  Heart : Chest pain, history of coronary arterey disease  GI: Nausea, vomiting, diarrhea, constipation, GERD  GU: Dysuria, history of prostate cancer  Neuro: Stroke, seizures, syncope  Psych: Depression, anxiety, hallucinations  Physical Exam:  Blood pressure 110/53, pulse 110, temperature 97.3 F (36.3 C), temperature source Oral, resp. rate 25, SpO2 99.00%.  Constitutional: Patient is a well-developed and well-nourished male* in no acute distress and cooperative with exam.  Head: Normocephalic and atraumatic  Mouth: Mucus membranes moist  Eyes: PERRL, EOMI, conjunctivae normal  Neck: Supple, No Thyromegaly  Cardiovascular: RRR, S1 normal, S2 normal  Pulmonary/Chest: CTAB, no  wheezes, rales, or rhonchi  Abdominal: Soft. Positive tenderness to palpation, mild distention, mild guarding, bowel sounds are normal, no masses, organomegaly, or guarding present.  Neurological: A&O x3, Strenght is normal and symmetric bilaterally, cranial nerve II-XII are grossly intact, no focal motor deficit, sensory intact to light touch bilaterally.  Extremities : No Cyanosis, Clubbing or Edema  Labs on Admission:    Results for orders placed during the hospital encounter of 11/07/2013 (from the past 48 hour(s))    PROTIME-INR Status: Abnormal     Collection Time     11/07/2013 11:00 AM    Result  Value  Ref Range     Prothrombin Time  16.8 (*)  11.6 - 15.2 seconds     INR  1.40  0.00 - 1.49    APTT Status: None     Collection Time     11/16/2013 11:00 AM    Result  Value  Ref Range     aPTT  28  24 - 37 seconds    CBC WITH DIFFERENTIAL Status: Abnormal     Collection Time     11/20/2013 11:08 AM    Result  Value  Ref Range     WBC  39.9 (*)  4.0 - 10.5 K/uL     RBC  4.70  4.22 - 5.81 MIL/uL     Hemoglobin  14.1  13.0 - 17.0 g/dL     HCT  43.7  39.0 - 52.0 %     MCV  93.0  78.0 - 100.0 fL     MCH  30.0  26.0 - 34.0 pg     MCHC  32.3  30.0 - 36.0 g/dL     RDW  20.9 (*)  11.5 - 15.5 %     Platelets  150  150 - 400 K/uL     Neutrophils Relative %  84 (*)  43 - 77 %     Lymphocytes Relative  6 (*)  12 - 46 %     Monocytes Relative  10  3 - 12 %     Eosinophils Relative  0  0 - 5 %     Basophils Relative  0  0 - 1 %     Neutro Abs  33.5 (*)  1.7 - 7.7 K/uL     Lymphs Abs  2.4  0.7 - 4.0 K/uL     Monocytes Absolute  4.0 (*)  0.1 - 1.0  K/uL     Eosinophils Absolute  0.0  0.0 - 0.7 K/uL     Basophils Absolute  0.0  0.0 - 0.1 K/uL     WBC Morphology  INCREASED BANDS (>20% BANDS)      Smear Review  LARGE PLATELETS PRESENT     COMPREHENSIVE METABOLIC PANEL Status: Abnormal     Collection Time     10/31/2013 11:08 AM    Result  Value  Ref Range     Sodium  154 (*)  137 - 147 mEq/L      Potassium  4.0  3.7 - 5.3 mEq/L     Chloride  111  96 - 112 mEq/L     CO2  20  19 - 32 mEq/L     Glucose, Bld  80  70 - 99 mg/dL     BUN  29 (*)  6 - 23 mg/dL     Creatinine, Ser  1.14  0.50 - 1.35 mg/dL     Calcium  9.6  8.4 - 10.5 mg/dL     Total Protein  6.0  6.0 - 8.3 g/dL     Albumin  2.5 (*)  3.5 - 5.2 g/dL     AST  32  0 - 37 U/L     ALT  15  0 - 53 U/L     Alkaline Phosphatase  157 (*)  39 - 117 U/L     Total Bilirubin  1.6 (*)  0.3 - 1.2 mg/dL     GFR calc non Af Amer  55 (*)  >90 mL/min     GFR calc Af Amer  63 (*)  >90 mL/min     Comment:  (NOTE)      The eGFR has been calculated using the CKD EPI equation.      This calculation has not been validated in all clinical situations.      eGFR's persistently <90 mL/min signify possible Chronic Kidney      Disease.    I-STAT CG4 LACTIC ACID, ED Status: Abnormal     Collection Time     11/06/2013 11:18 AM    Result  Value  Ref Range     Lactic Acid, Venous  9.49 (*)  0.5 - 2.2 mmol/L    URINALYSIS, ROUTINE W REFLEX MICROSCOPIC Status: Abnormal     Collection Time     11/18/2013 11:18 AM    Result  Value  Ref Range     Color, Urine  AMBER (*)  YELLOW     Comment:  BIOCHEMICALS MAY BE AFFECTED BY COLOR     APPearance  CLOUDY (*)  CLEAR     Specific Gravity, Urine  1.028  1.005 - 1.030     pH  5.5  5.0 - 8.0     Glucose, UA  NEGATIVE  NEGATIVE mg/dL     Hgb urine dipstick  MODERATE (*)  NEGATIVE     Bilirubin Urine  NEGATIVE  NEGATIVE     Ketones, ur  NEGATIVE  NEGATIVE mg/dL     Protein, ur  30 (*)  NEGATIVE mg/dL     Urobilinogen, UA  0.2  0.0 - 1.0 mg/dL     Nitrite  NEGATIVE  NEGATIVE     Leukocytes, UA  NEGATIVE  NEGATIVE    URINE MICROSCOPIC-ADD ON Status: None     Collection Time     11/02/2013 11:18 AM    Result  Value  Ref Range  WBC, UA  0-2  <3 WBC/hpf     RBC / HPF  11-20  <3 RBC/hpf     Bacteria, UA  RARE  RARE     Urine-Other  AMORPHOUS URATES/PHOSPHATES      Radiological Exams on Admission:  Dg Chest  Port 1 View  11/05/2013 CLINICAL DATA: Demented, community acquired pneumonia EXAM: PORTABLE CHEST - 1 VIEW COMPARISON: 10/18/2013 FINDINGS: Persistent patchy subsegmental atelectasis or infiltrates at both lung bases, left greater than right. No new infiltrate. Heart size normal. Atheromatous aorta. . No effusion. Visualized skeletal structures are unremarkable. IMPRESSION: Little change in bibasilar infiltrates or atelectasis, left greater than right. Electronically Signed By: Arne Cleveland M.D. On: 10/30/2013 11:30   Assessment/Plan  Principal Problem:  Sepsis  Active Problems:  Dementia  Prostate Cancer, Personal History of - S/p Seed implants 2000  Adrenal insufficiency, presumed  Severe sepsis  Hypernatremia  Sepsis/ septic shock  At this time patient is being admitted to step down unit with diagnoses of sepsis, patient will be started on IV fluids. Will also start hydrocortisone 100 mg every 8 hours as patient has presumed adrenal insufficiency. We'll continue with vancomycin and Zosyn. Likely cause of the sepsis is UTI, but patient also has abdominal tenderness to palpation along with elevated lactic acid so will also get CT abdomen pelvis to rule out underlying perforation. Patient has a history of prostate cancer with possible metastases  Dementia  Continue Namenda and Aricept  We'll continue Remeron at bedtime  Hypernatremia  Sodium was 154, once patient is more stable will change IV fluids to D5 W. as needed for hypernatremia  HCAP  Patient will be started on vancomycin and Zosyn. We'll follow blood cultures.  UTI  Patient has been started on Zosyn, UA is not grossly abnormal but he did have difficulty with urination and has been treated with Levaquin and Avelox for last 10 days. Will follow the urine culture results.  Goals of care discussion  I discussed in detail with patient's wife and daughter at bedside, patient is a DO NOT RESUSCITATE and they understand poor prognosis.  They do not wish to pursue aggressive measures if patient starts to decline clinically. We'll continue with the current management at this time  Code status:  Time Spent on Admission:  75 min  [facility]  Pager: [phone]  11/18/2013, 1:45 PM  If 7PM-7AM, please contact night-coverage  [email or web address]  Password [email or web address]

## 2013-12-08 NOTE — Progress Notes (Signed)
This encounter was created in error - please disregard.  This encounter was created in error - please disregard.

## 2013-12-23 NOTE — Progress Notes (Signed)
Nutrition Brief Note  Patient identified on the Malnutrition Screening Tool (MST) Report and for having low braden score.   Wt Readings from Last 10 Encounters:  11/22/13 123 lb 10.9 oz (56.1 kg)  10/21/13 132 lb 6.4 oz (60.056 kg)  10/10/13 123 lb (55.792 kg)  10/03/13 123 lb (55.792 kg)  09/14/13 127 lb (57.607 kg)  07/06/13 126 lb (57.153 kg)  02/23/13 121 lb (54.885 kg)  02/15/13 120 lb (54.432 kg)  12/19/12 119 lb (53.978 kg)  09/23/12 123 lb 3.2 oz (55.883 kg)    Body mass index is 16.77 kg/(m^2). Patient meets criteria for underweight based on current BMI.   Admitted with difficulty making urine, hx of Alzheimer's disease, hyperlipidemia, prostate CA, and skin cancer. Found to have underlying bowel perforation, multiple abscesses, ascites, metastatic prostate cancer in addition to sepsis/septic shock. Plan is for comfort care, nutrition signing off.   No nutrition interventions warranted at this time. If nutrition issues arise, please consult RD.   Mikey College MS, [place], [place] Pager [phone] After Hours Pager

## 2013-12-23 NOTE — Progress Notes (Signed)
[facility] PROGRESS NOTE  Vincent Lee QIO:[id number] DOB: 1921-02-13 DOA: 11/17/2013 PCP: Unice Cobble, MD  Assessment/Plan:  Sepsis/ septic shock  Patient was admitted to step down with diagnoses of sepsis, started on hydrocortisone, vancomycin and Zosyn. CT abdomen pelvis showed underlying bowel perforation, multiple abscesses, ascites, metastatic prostate cancer. Patient has been made comfort care, so off the antibiotics at this time  Dementia  Will hold by mouth medications at this time. Patient is total comfort care  Comfort care Discussed with family in detail, they do not want aggressive measures. We'll start morphine infusion at 1 mg per hour, titrate to comfort up to 5 mg per hour.  Ativan 2 mg IV every 4 hours when necessary.  Code Status: DNR  Family Communication:Discussed with patients wife in detail, they were told about perforation and above ct findings, they understand that patient is not a surgical candidate and comfort care is the best approach at this point of time. Family agrees with comfort care.   *Disposition Plan: Anticipate hospital death    Consultants:  None  Procedures:  None  Antibiotics:  Vancomycin 2/28--3/1  Zosyn 2/28--3/1  HPI/Subjective: 80 year old male who has a past medical history of Alzheimer disease; Hyperlipidemia; H/O prostate cancer; Cataract; and Cancer of skin.  Today was brought to the hospital after patient had difficulty making urine, nursing staff at the assisted living facility were unable to catheterization the patient. Patient has a history of dementia, he has been treated with Levaquin then Avelox for last 10 days for possible pneumonia.  In the ED patient was found to be profoundly hypotensive, code sepsis was initiated. Patient was found to have abnormal UA with chest x-ray showing bibasilar infiltrates or atelectasis left more than right, vancomycin and Zosyn for initiated. At this time patient complains  of abdominal pain, Foley catheter has been inserted and surprisingly patient was not found to have urinary retention.  He was found to have leukocytosis with white count of 39,000, lactate 9.49, the patient is alert but a poor historian. Patient's daughter and wife are at bedside. ct scan result, which shows ascites, bowel perforation, multiple abscesses, widespread metastatic disease from prostrate cancer  Patient seen and examined, today appears tachypneic and with mild respiratory distress heart rate in 120s. Patient was started on scheduled morphine 2 mg IV every 4 hours yesterday.  Objective: Filed Vitals:   12/17/2013 0600  BP: 73/46  Pulse: 125  Temp:   Resp: 22    Intake/Output Summary (Last 24 hours) at 12/10/2013 0758 Last data filed at 12/08/2013 0600  Gross per 24 hour  Intake    280 ml  Output    300 ml  Net    -20 ml   Filed Weights   11/19/2013 1706 11/22/13 0000  Weight: 55.5 kg (122 lb 5.7 oz) 56.1 kg (123 lb 10.9 oz)    Exam: Physical Exam: Head: Normocephalic, atraumatic.  Eyes: No signs of jaundice, as patient is comfort Nose: Mucous membranes dry.  Throat: Oropharynx nonerythematous, no exudate appreciated.  Neck: supple,No deformities, masses, or tenderness noted. Lungs: Tachypnea, lungs clear to auscultation bilaterally Heart: Regular RR. S1 and S2 normal  Abdomen: BS normoactive. Soft, Nondistended, non-tender.  Extremities: No pretibial edema, no erythema   Data Reviewed: Basic Metabolic Panel:  Recent Labs Lab 10/29/2013 1108 11/22/13 0337  NA 154* 154*  K 4.0 4.0  CL 111 121*  CO2 20 18*  GLUCOSE 80 81  BUN 29* 30*  CREATININE 1.14 0.97  CALCIUM 9.6 7.6*   Liver Function Tests:  Recent Labs Lab 11/04/2013 1108 11/22/13 0337  AST 32 143*  ALT 15 40  ALKPHOS 157* 109  BILITOT 1.6* 1.0  PROT 6.0 4.7*  ALBUMIN 2.5* 1.8*   No results found for this basename: LIPASE, AMYLASE,  in the last 168 hours No results found for this basename:  AMMONIA,  in the last 168 hours CBC:  Recent Labs Lab 11/17/2013 1108 11/22/13 0337  WBC 39.9* 42.9*  NEUTROABS 33.5*  --   HGB 14.1 11.5*  HCT 43.7 36.9*  MCV 93.0 93.2  PLT 150 121*   Cardiac Enzymes: No results found for this basename: CKTOTAL, CKMB, CKMBINDEX, TROPONINI,  in the last 168 hours BNP (last 3 results)  Recent Labs  10/17/13 1045  PROBNP 304.2   CBG: No results found for this basename: GLUCAP,  in the last 168 hours  Recent Results (from the past 240 hour(s))  CULTURE, BLOOD (ROUTINE X 2)     Status: None   Collection Time    11/06/2013 11:00 AM      Result Value Ref Range Status   Specimen Description BLOOD RIGHT ARM   Final   Special Requests BOTTLES DRAWN AEROBIC AND ANAEROBIC [facility] EACH   Final   Culture  Setup Time     Final   Value: 10/30/2013 23:31     Performed at [facility]   Culture     Final   Value:        BLOOD CULTURE RECEIVED NO GROWTH TO DATE CULTURE WILL BE HELD FOR 5 DAYS BEFORE ISSUING A FINAL NEGATIVE REPORT     Performed at [facility]   Report Status PENDING   Incomplete  CULTURE, BLOOD (ROUTINE X 2)     Status: None   Collection Time    11/03/2013 11:10 AM      Result Value Ref Range Status   Specimen Description BLOOD RIGHT HAND   Final   Special Requests BOTTLES DRAWN AEROBIC ONLY 3CC   Final   Culture  Setup Time     Final   Value: 11/07/2013 23:31     Performed at [facility]   Culture     Final   Value:        BLOOD CULTURE RECEIVED NO GROWTH TO DATE CULTURE WILL BE HELD FOR 5 DAYS BEFORE ISSUING A FINAL NEGATIVE REPORT     Performed at [facility]   Report Status PENDING   Incomplete  MRSA PCR SCREENING     Status: None   Collection Time    10/25/2013  2:20 PM      Result Value Ref Range Status   MRSA by PCR NEGATIVE  NEGATIVE Final   Comment:            The GeneXpert MRSA Assay (FDA     approved for NASAL specimens     only), is one component of a     comprehensive MRSA colonization      surveillance program. It is not     intended to diagnose MRSA     infection nor to guide or     monitor treatment for     MRSA infections.     Studies: Ct Abdomen Pelvis W Contrast  11/01/2013   CLINICAL DATA:  Demented. Hyperlipidemia. Prostate cancer. Skin cancer. Decreased urine output. Abdominal pain. Evaluate for perforation or ischemic bowel.  EXAM: CT ABDOMEN AND PELVIS WITH CONTRAST  TECHNIQUE:  Multidetector CT imaging of the abdomen and pelvis was performed using the standard protocol following bolus administration of intravenous contrast.  CONTRAST:  55mL OMNIPAQUE IOHEXOL 300 MG/ML  SOLN  COMPARISON:  CT ABD/PELVIS W CM dated 02/15/2013; DG CHEST 1V PORT dated 11/12/2013  FINDINGS: Lower Chest: Left greater than right patchy bibasilar airspace disease. Mild cardiomegaly with a small hiatal hernia. Small bilateral pleural effusions.  Abdomen/Pelvis: Development of multiple low-density liver lesions, consistent with metastatic disease. Index posterior segment right liver lobe lesion measures 4.4 x 3.7 cm on image 18. Pericholecystic liver lesion measures 4.3 x 3.8 cm on image 26.  Normal spleen, distal stomach. Transverse duodenal diverticulum. Normal pancreas, gallbladder, biliary tract, right adrenal gland. Mild left adrenal thickening is felt to be similar.  Mild bilateral renal atrophy. Too small to characterize lower pole left renal lesion. Interpolar left renal cyst measures 3.7 cm.  Aortic and branch vessel atherosclerosis. No retroperitoneal or retrocrural adenopathy.  Low pelvis minimally excluded. The sigmoid wall is thickened, including on image 74/ series 2. Extensive diverticular on this region and throughout the remainder of the colon.  Normal terminal ileum. Appendix not well visualized. No evidence of bowel obstruction. Prominent nodes identified in the gastrohepatic ligament, including a 1.5 x 1.5 cm node on image 25. This is new. Small volume extraluminal air identified,  including within the right-sided abdomen on image 57 and in the perihepatic space on image 22. Complex fluid collections are identified throughout the abdomen and pelvis with areas of peritoneal thickening, suggesting infected ascites and developing abscesses. Example in the right pelvis at 9.2 x 3.1 cm on image 71. Also in the perihepatic space.  Extensive omental and peritoneal nodularity, highly suspicious for metastatic disease. Example nodule in the left omentum on image 46 measuring 1.8 cm. These areas are new.  Fluid is seen within a a left inguinal hernia, this is unchanged.  No pelvic adenopathy. Foley catheter within the urinary bladder. Radiation seeds surround the prostate.  Bones/Musculoskeletal: Probable bone island in the right side of the sacrum. Degenerative disc disease at the lumbosacral junction.  IMPRESSION: 1. Foci of extraluminal air, most consistent with bowel perforation. Difficult to localize the source. Given the colonic wall thickening, this could arise from inflammatory or neoplastic process in the colon. Alternatively, given perigastric adenopathy, a gastric source would be an alternate consideration. 2. Widespread metastatic disease, including within the liver, nodal stations, and peritoneum. 3. Multiple fluid collections with peritoneal enhancement. Suspicious for infected ascites and developing abscesses. 4. Small bilateral pleural effusions with bibasilar airspace disease, suspicious for infection. Critical test results telephoned to Freda Munro, r.n. at the time of interpretation at 5:49 p.m.on 11/19/2013.   Electronically Signed   By: Abigail Miyamoto M.D.   On: 11/02/2013 17:50   Dg Chest Port 1 View  11/05/2013   CLINICAL DATA:  Demented, community acquired pneumonia  EXAM: PORTABLE CHEST - 1 VIEW  COMPARISON:  10/18/2013  FINDINGS: Persistent patchy subsegmental atelectasis or infiltrates at both lung bases, left greater than right. No new infiltrate. Heart size normal. Atheromatous  aorta. . No effusion. Visualized skeletal structures are unremarkable.  IMPRESSION: Little change in bibasilar infiltrates or atelectasis, left greater than right.   Electronically Signed   By: Arne Cleveland M.D.   On: 11/05/2013 11:30    Scheduled Meds: . donepezil  5 mg Oral QHS  . enoxaparin (LOVENOX) injection  40 mg Subcutaneous QHS  . mirtazapine  7.5 mg Oral QHS   Continuous Infusions: .  sodium chloride 1,000 mL (11/17/2013 1815)  . morphine      Principal Problem:   Sepsis Active Problems:   Dementia   Prostate Cancer, Personal History of - S/p Seed implants 2000   Adrenal insufficiency, presumed   Severe sepsis    Time spent: 25 min    [facility] Pager [phone]. If 7PM-7AM, please contact night-coverage at [email or web address], password [facility] 12/19/2013, 7:58 AM  LOS: 2 days

## 2013-12-23 NOTE — Progress Notes (Signed)
[place], RN, BSN, CCM [phone] Chart Reviewed for discharge and hospital needs. Discharge needs at time of review:  None present will follow for needs. Review of patient progress due on [id number].

## 2013-12-23 NOTE — Progress Notes (Signed)
26-Nov-2013 1600  Clinical Encounter Type  Visited With Family (wife, dtr Manuela Schwartz, associate pastor)  Visit Type Death  Referral From Nurse  Spiritual Encounters  Spiritual Needs Grief support;Emotional  Stress Factors  Family Stress Factors Loss;Major life changes (married 13 years)   Visited primarily with Mrs Kerlin to offer bereavement support.  She was very appreciative of pastoral presence and the comfort of physical contact, holding my hands and asking for hugs.  Per spouse, she and Mr Varkey were married for 33 years and moved into [facility] 8 months ago, where they have been building some community.  Family has taken time to process and grieve at bedside and plans to prepare for departure shortly.  [place], [place]

## 2013-12-23 NOTE — Discharge Summary (Signed)
Death Summary  EWAN GRAU ZOX:[id number] DOB: 11-06-1920 DOA: 12/20/13  PCP: Unice Cobble, MD PCP/Office notified:  Admit date: 12/20/2013 Date of Death: 06-Jan-2014  Final Diagnoses:  Principal Problem:   Sepsis Active Problems:   Dementia   Prostate Cancer, Personal History of - S/p Seed implants 2000   Adrenal insufficiency, presumed   Severe sepsis      History of present illness:  80 year old male who has a past medical history of Alzheimer disease; Hyperlipidemia; H/O prostate cancer; Cataract; and Cancer of skin.  Today was brought to the hospital after patient had difficulty making urine, nursing staff at the assisted living facility were unable to catheterization the patient. Patient has a history of dementia, he has been treated with Levaquin then Avelox for last 10 days for possible pneumonia.  In the ED patient was found to be profoundly hypotensive, code sepsis was initiated. Patient was found to have abnormal UA with chest x-ray showing bibasilar infiltrates or atelectasis left more than right, vancomycin and Zosyn for initiated. At this time patient complains of abdominal pain, Foley catheter has been inserted and surprisingly patient was not found to have urinary retention.  He was found to have leukocytosis with white count of 39,000, lactate 9.49, the patient is alert but a poor historian. Patient's daughter and wife are at [place] Hospital Course:  Sepsis/ septic shock  Patient was admitted to step down with diagnoses of sepsis, started on hydrocortisone, vancomycin and Zosyn. CT abdomen pelvis showed underlying bowel perforation, multiple abscesses, ascites, metastatic prostate cancer. Patient was  made comfort care, so off the antibiotics     Comfort care  Discussed with family in detail, they did  not want aggressive measures. Because started on  morphine infusion at 1 mg per hour, titrate to comfort up to 5 mg per hour. Ativan 2 mg IV every 4 hours  when necessary.   Patient expired on 12/05/2013      Time: 35 minutes  Signed:  Erskine Steinfeldt S  [facility] 06-Jan-2014, 7:29 PM

## 2013-12-23 DEATH — deceased

## 2014-07-06 ENCOUNTER — Ambulatory Visit: Payer: Medicare Other | Admitting: Neurology

## 2014-07-17 IMAGING — CR DG CHEST 2V
2 series · 2 of 2 positions shown · non-contrast
Comparison: 07/23/2013

CLINICAL DATA: Cough, fever

EXAM:
CHEST  2 VIEW

[PA]
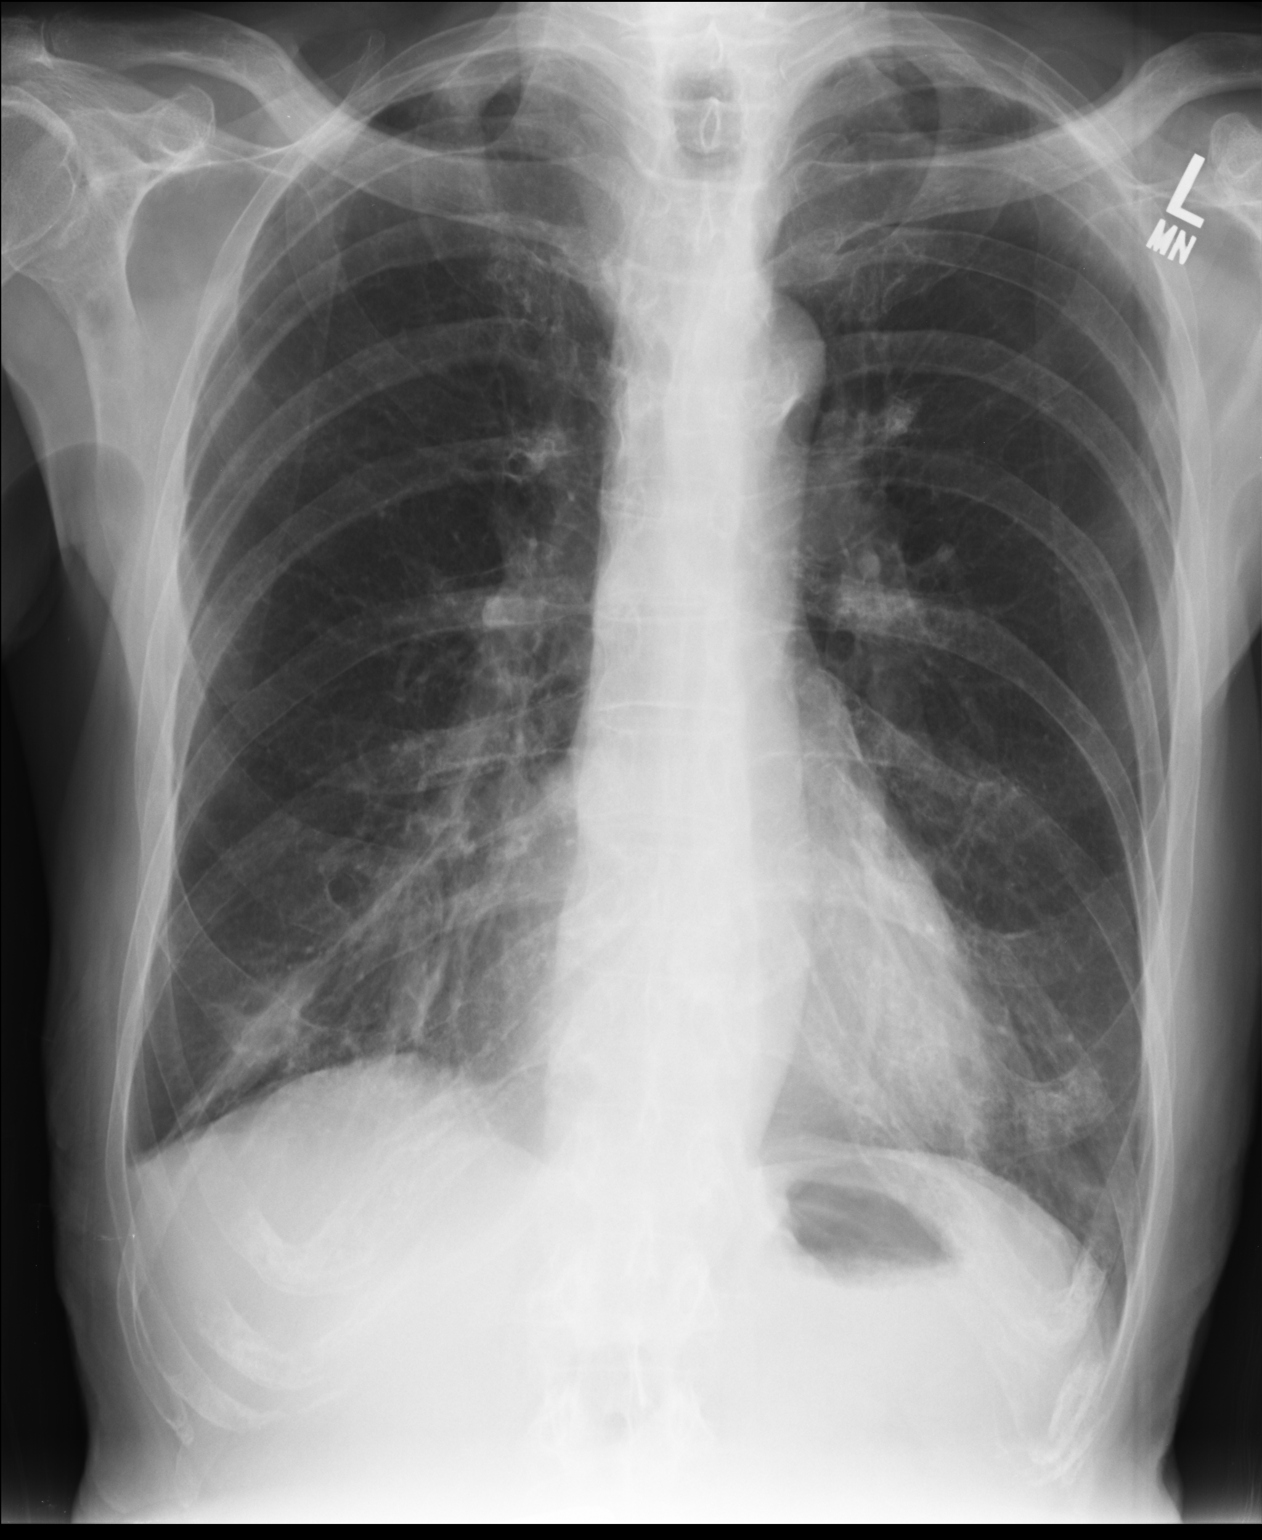

[lateral]
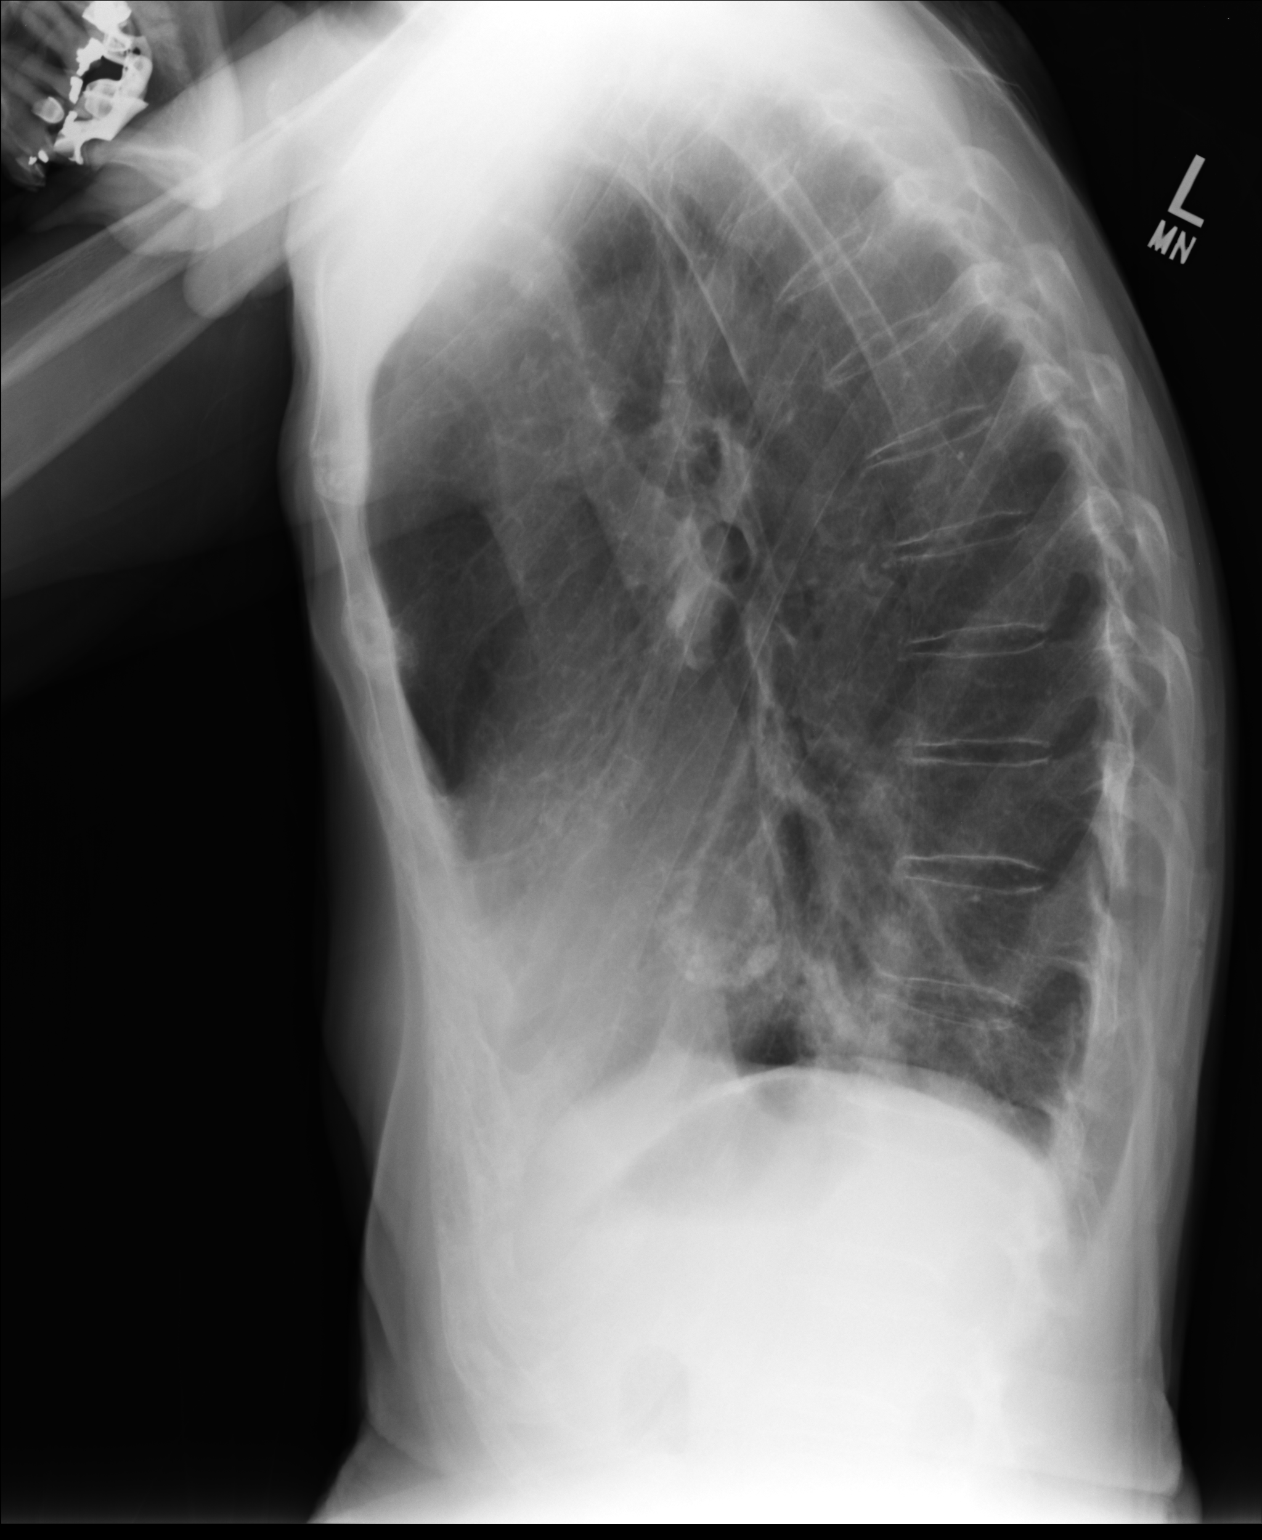

[2 of 2 positions shown; findings below may reference images not displayed]

FINDINGS: Cardiomediastinal silhouette is stable. No pulmonary edema.
Hyperinflation and chronic interstitial prominence again noted.
There is streaky airspace disease in right base suspicious for early
infiltrate.
IMPRESSION: Hyperinflation and chronic interstitial prominence. Streaky airspace
disease right base suspicious for early infiltrate.

## 2014-07-24 IMAGING — CR DG CHEST 2V
2 series · 2 of 2 positions shown · non-contrast
Comparison: 10/03/2013

CLINICAL DATA: Cough, loss of appetite

EXAM:
CHEST  2 VIEW

[PA]
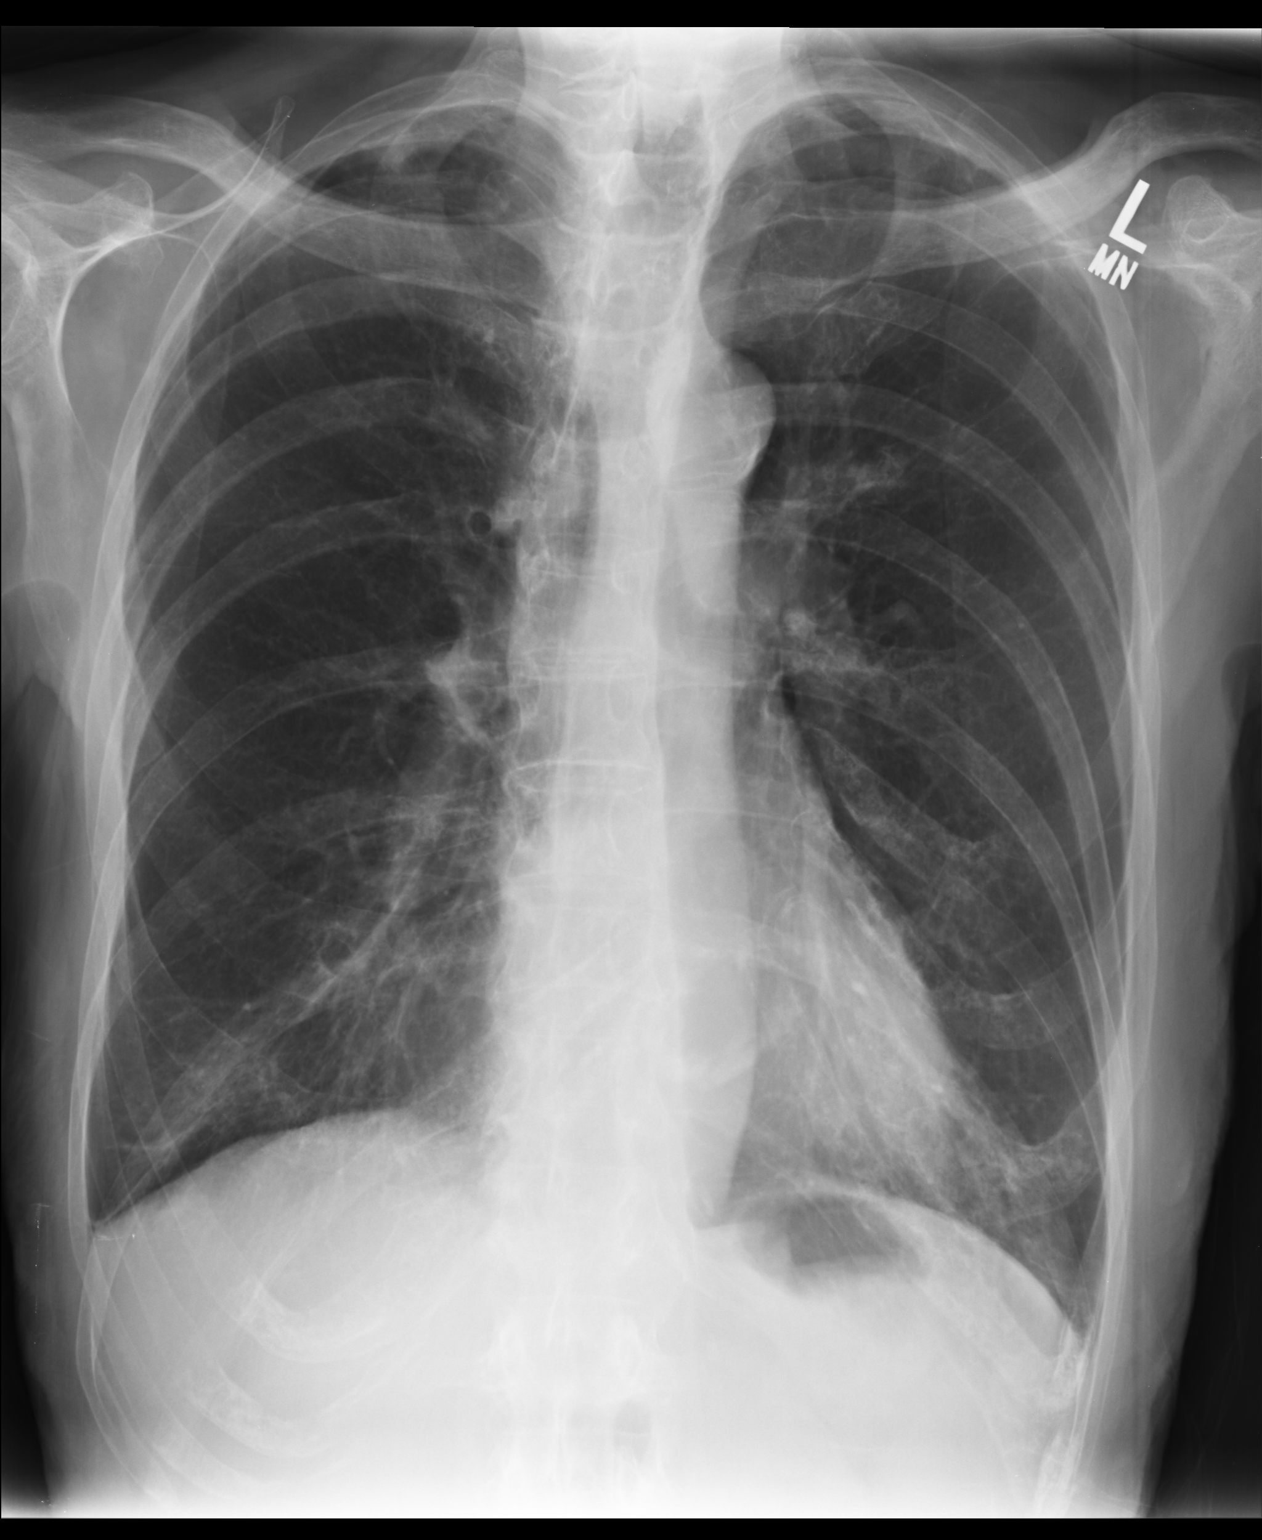

[lateral]
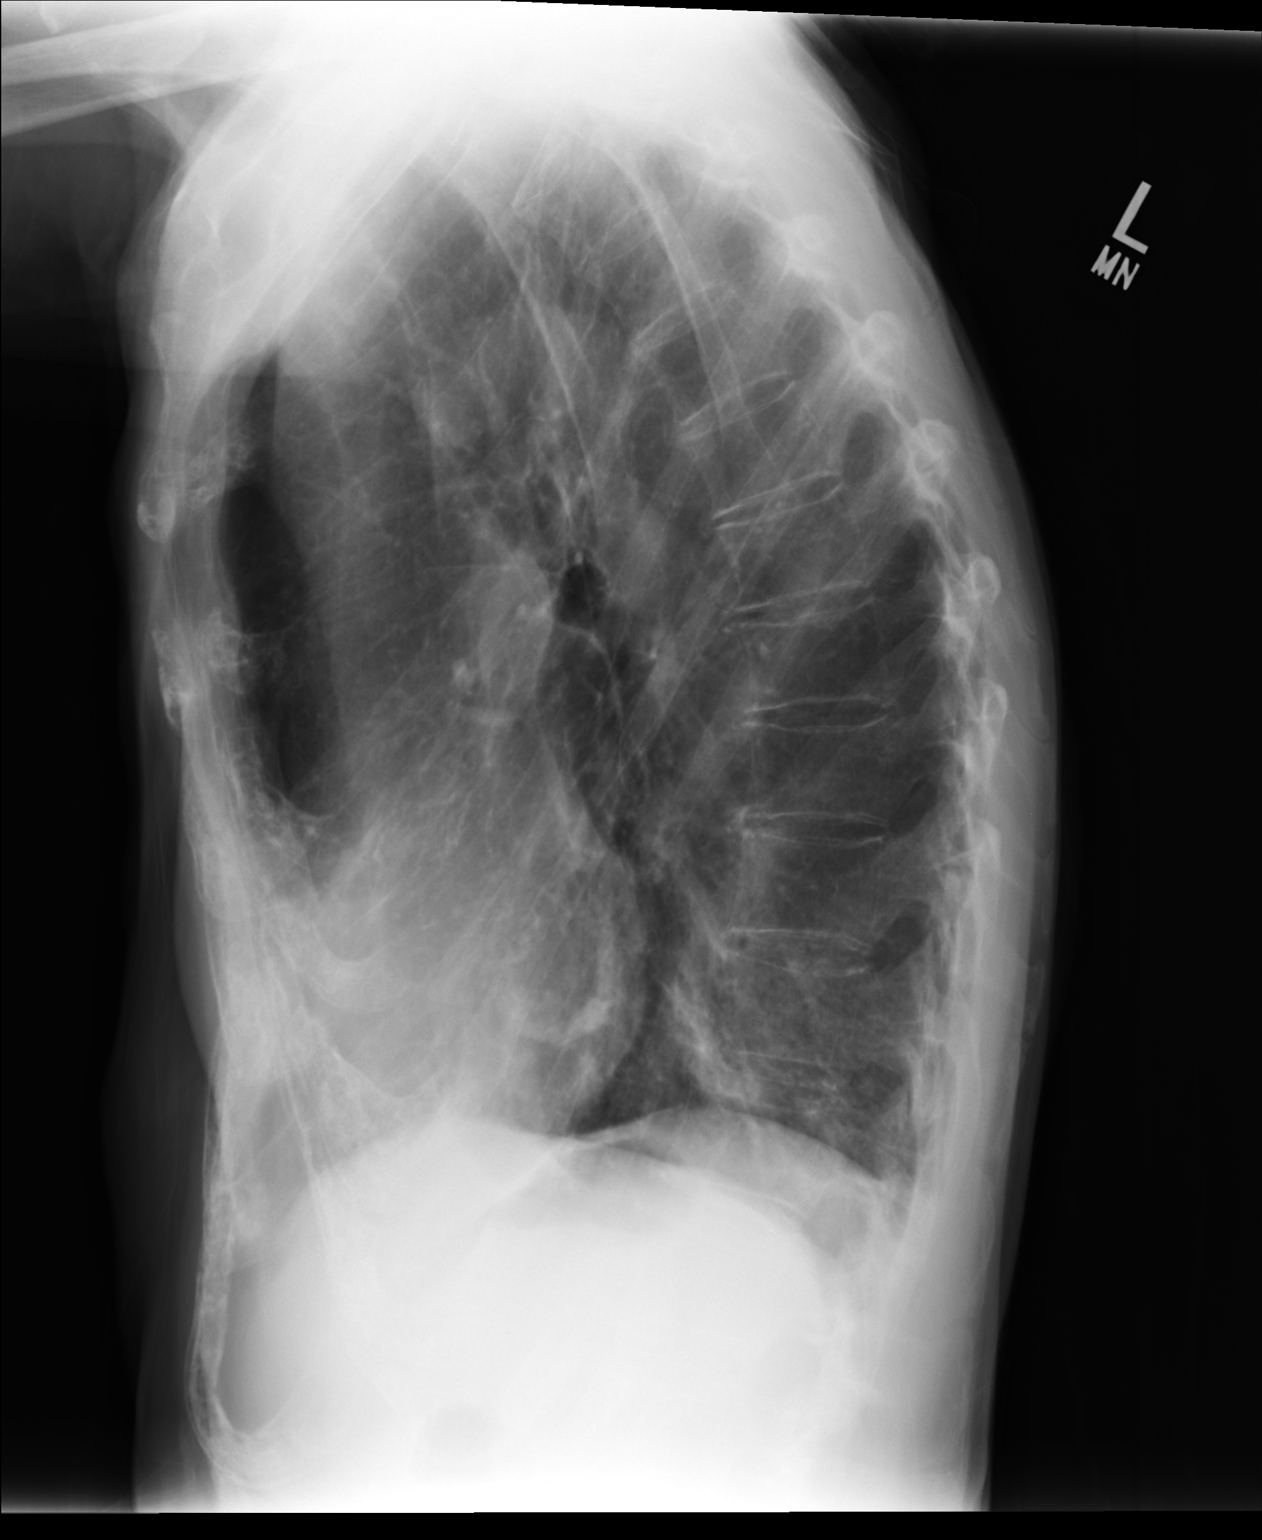

[2 of 2 positions shown; findings below may reference images not displayed]

FINDINGS: Chronic interstitial markings/emphysematous changes. Bilateral lower
lobe scarring/ atelectasis. Mild retrocardiac opacity persists on
the lateral view, pneumonia not excluded.

The heart is normal in size.

Mild degenerative changes of the visualized thoracolumbar spine.
IMPRESSION: Chronic interstitial markings/emphysematous changes with suspected
bilateral lower lobe scarring/atelectasis.

Mild retrocardiac opacity persists on the lateral view, pneumonia
not excluded.
